# Patient Record
Sex: Female | Born: 1980 | Race: White | Hispanic: No | Marital: Married | Smoking: Never smoker
Health system: Southern US, Community
[De-identification: ages and names within clinical notes are randomized; demographics above are authoritative.]

## PROBLEM LIST (undated history)

## (undated) ENCOUNTER — Encounter

## (undated) ENCOUNTER — Encounter: Attending: Ambulatory Care | Primary: Ambulatory Care

## (undated) ENCOUNTER — Encounter: Attending: Internal Medicine | Primary: Internal Medicine

## (undated) ENCOUNTER — Encounter: Attending: Family Medicine | Primary: Family Medicine

## (undated) ENCOUNTER — Ambulatory Visit
Payer: PRIVATE HEALTH INSURANCE | Attending: Student in an Organized Health Care Education/Training Program | Primary: Student in an Organized Health Care Education/Training Program

## (undated) ENCOUNTER — Ambulatory Visit

## (undated) ENCOUNTER — Encounter
Attending: Student in an Organized Health Care Education/Training Program | Primary: Student in an Organized Health Care Education/Training Program

## (undated) ENCOUNTER — Encounter: Attending: Family | Primary: Family

## (undated) ENCOUNTER — Encounter: Attending: "Endocrinology | Primary: "Endocrinology

## (undated) ENCOUNTER — Telehealth: Payer: PRIVATE HEALTH INSURANCE

## (undated) ENCOUNTER — Telehealth: Attending: Surgery | Primary: Surgery

## (undated) ENCOUNTER — Ambulatory Visit: Payer: PRIVATE HEALTH INSURANCE

## (undated) ENCOUNTER — Telehealth: Attending: Internal Medicine | Primary: Internal Medicine

## (undated) ENCOUNTER — Encounter: Payer: PRIVATE HEALTH INSURANCE | Attending: Mental Health | Primary: Mental Health

## (undated) ENCOUNTER — Telehealth

## (undated) ENCOUNTER — Ambulatory Visit: Payer: Medicare (Managed Care) | Attending: Mental Health | Primary: Mental Health

## (undated) ENCOUNTER — Encounter: Attending: Infectious Disease | Primary: Infectious Disease

## (undated) ENCOUNTER — Ambulatory Visit: Payer: MEDICARE

## (undated) ENCOUNTER — Ambulatory Visit: Attending: Family | Primary: Family

## (undated) ENCOUNTER — Telehealth: Attending: Ambulatory Care | Primary: Ambulatory Care

## (undated) ENCOUNTER — Ambulatory Visit: Payer: PRIVATE HEALTH INSURANCE | Attending: Internal Medicine | Primary: Internal Medicine

## (undated) ENCOUNTER — Ambulatory Visit: Payer: PRIVATE HEALTH INSURANCE | Attending: Surgery | Primary: Surgery

## (undated) ENCOUNTER — Telehealth: Attending: Student Health | Primary: Student Health

## (undated) ENCOUNTER — Encounter: Attending: Mental Health | Primary: Mental Health

## (undated) ENCOUNTER — Telehealth: Attending: "Endocrinology | Primary: "Endocrinology

## (undated) ENCOUNTER — Encounter: Payer: Medicare (Managed Care) | Attending: Mental Health | Primary: Mental Health

## (undated) ENCOUNTER — Ambulatory Visit: Payer: PRIVATE HEALTH INSURANCE | Attending: Family Medicine | Primary: Family Medicine

## (undated) ENCOUNTER — Ambulatory Visit: Payer: PRIVATE HEALTH INSURANCE | Attending: Mental Health | Primary: Mental Health

## (undated) ENCOUNTER — Telehealth: Attending: Family | Primary: Family

## (undated) ENCOUNTER — Telehealth
Attending: Student in an Organized Health Care Education/Training Program | Primary: Student in an Organized Health Care Education/Training Program

## (undated) ENCOUNTER — Encounter: Payer: MEDICAID | Attending: Mental Health | Primary: Mental Health

## (undated) ENCOUNTER — Ambulatory Visit
Payer: Medicare (Managed Care) | Attending: Student in an Organized Health Care Education/Training Program | Primary: Student in an Organized Health Care Education/Training Program

## (undated) ENCOUNTER — Ambulatory Visit: Payer: PRIVATE HEALTH INSURANCE | Attending: "Endocrinology | Primary: "Endocrinology

## (undated) ENCOUNTER — Ambulatory Visit: Payer: MEDICARE | Attending: Mental Health | Primary: Mental Health

## (undated) ENCOUNTER — Ambulatory Visit
Payer: PRIVATE HEALTH INSURANCE | Attending: Rehabilitative and Restorative Service Providers" | Primary: Rehabilitative and Restorative Service Providers"

## (undated) ENCOUNTER — Telehealth: Attending: Family Medicine | Primary: Family Medicine

## (undated) ENCOUNTER — Ambulatory Visit: Attending: Mental Health | Primary: Mental Health

## (undated) ENCOUNTER — Ambulatory Visit: Payer: PRIVATE HEALTH INSURANCE | Attending: Family | Primary: Family

## (undated) DIAGNOSIS — M329 Systemic lupus erythematosus, unspecified: Secondary | ICD-10-CM

## (undated) DIAGNOSIS — H838X3 Other specified diseases of inner ear, bilateral: Secondary | ICD-10-CM

## (undated) DIAGNOSIS — G43909 Migraine, unspecified, not intractable, without status migrainosus: Secondary | ICD-10-CM

## (undated) DIAGNOSIS — E079 Disorder of thyroid, unspecified: Secondary | ICD-10-CM

## (undated) HISTORY — PX: THYROIDECTOMY: SHX17

## (undated) HISTORY — PX: ABDOMINAL HYSTERECTOMY: SHX81

## (undated) HISTORY — PX: FOOT SURGERY: SHX648

---
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
## 2004-08-20 ENCOUNTER — Emergency Department: Payer: Self-pay | Admitting: Emergency Medicine

## 2004-08-20 ENCOUNTER — Other Ambulatory Visit: Payer: Self-pay

## 2005-06-05 ENCOUNTER — Ambulatory Visit: Payer: Self-pay | Admitting: Family Medicine

## 2005-06-05 IMAGING — CR DG ABDOMEN 1V
1 series · 1 of 1 positions shown · non-contrast
Comparison: none

REASON FOR EXAM: LEFT upper pain
COMMENTS:

[view not recorded]
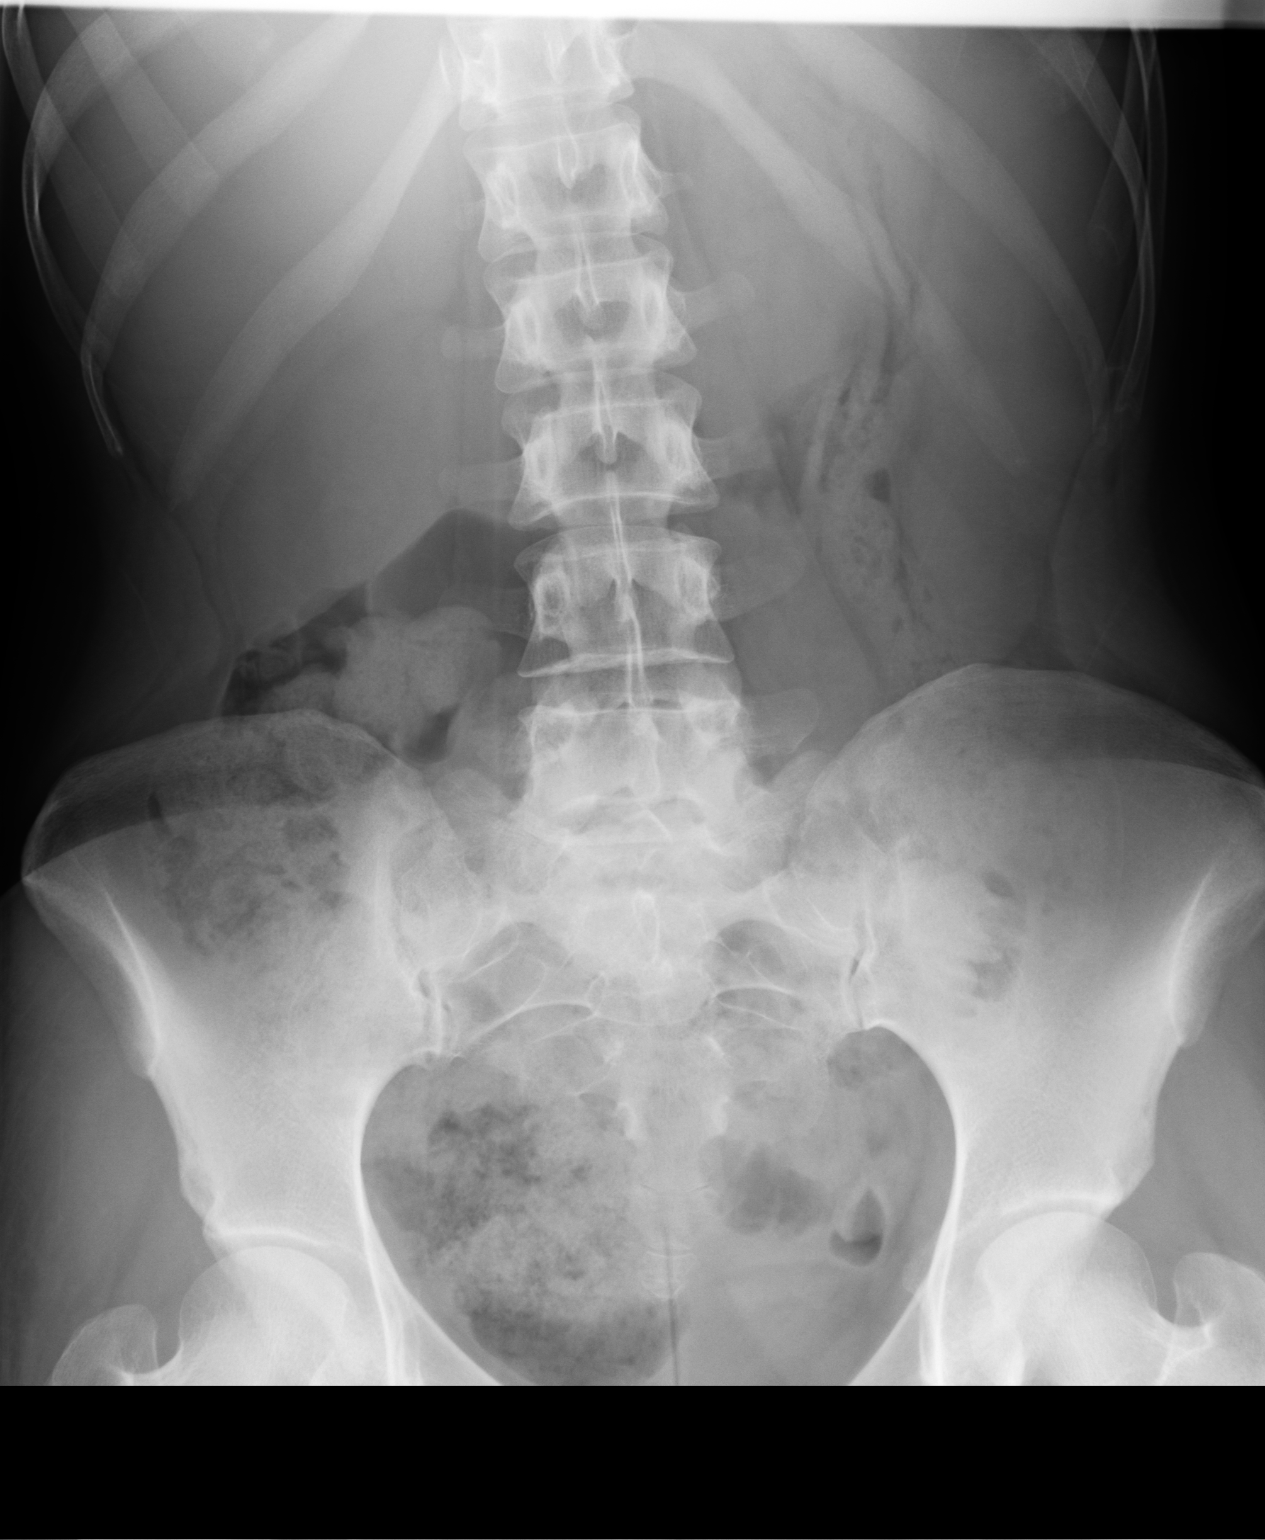

[1 of 1 positions shown; findings below may reference images not displayed]

PROCEDURE:     DXR - DXR KIDNEY URETER BLADDER  - [DATE]  [DATE]

RESULT:     A single AP view reveals a nonspecific gas pattern.  No abnormal
distention of large or small bowel is identified.

There is the suggestion that there is a prominent spleen and this should be
correlated clinically.  No abnormal calcifications are noted.
IMPRESSION: Nonspecific gas pattern.

Suggestion of mild splenomegaly which should be correlated clinically.

## 2005-06-09 ENCOUNTER — Ambulatory Visit: Payer: Self-pay | Admitting: Family Medicine

## 2005-06-28 ENCOUNTER — Encounter: Admission: RE | Admit: 2005-06-28 | Discharge: 2005-06-28 | Payer: Self-pay | Admitting: Rheumatology

## 2005-08-25 ENCOUNTER — Ambulatory Visit: Payer: Self-pay | Admitting: Family Medicine

## 2005-10-07 ENCOUNTER — Ambulatory Visit: Payer: Self-pay | Admitting: Family Medicine

## 2005-11-30 ENCOUNTER — Ambulatory Visit: Payer: Self-pay | Admitting: Gastroenterology

## 2005-12-29 ENCOUNTER — Ambulatory Visit: Payer: Self-pay | Admitting: Internal Medicine

## 2006-01-07 ENCOUNTER — Ambulatory Visit (HOSPITAL_COMMUNITY): Admission: RE | Admit: 2006-01-07 | Discharge: 2006-01-07 | Payer: Self-pay | Admitting: Obstetrics and Gynecology

## 2006-03-23 ENCOUNTER — Ambulatory Visit: Payer: Self-pay | Admitting: Internal Medicine

## 2006-04-16 ENCOUNTER — Other Ambulatory Visit: Admission: RE | Admit: 2006-04-16 | Discharge: 2006-04-16 | Payer: Self-pay | Admitting: Obstetrics and Gynecology

## 2006-04-22 ENCOUNTER — Ambulatory Visit: Payer: Self-pay | Admitting: Internal Medicine

## 2006-04-27 ENCOUNTER — Encounter: Admission: RE | Admit: 2006-04-27 | Discharge: 2006-04-27 | Payer: Self-pay | Admitting: Obstetrics and Gynecology

## 2007-02-04 ENCOUNTER — Ambulatory Visit: Payer: Self-pay | Admitting: Family Medicine

## 2007-02-07 ENCOUNTER — Encounter: Admission: RE | Admit: 2007-02-07 | Discharge: 2007-02-07 | Payer: Self-pay | Admitting: Endocrinology

## 2007-03-03 ENCOUNTER — Ambulatory Visit: Payer: Self-pay | Admitting: Family Medicine

## 2007-04-19 ENCOUNTER — Other Ambulatory Visit: Admission: RE | Admit: 2007-04-19 | Discharge: 2007-04-19 | Payer: Self-pay | Admitting: Obstetrics and Gynecology

## 2007-09-24 ENCOUNTER — Inpatient Hospital Stay (HOSPITAL_COMMUNITY): Admission: AD | Admit: 2007-09-24 | Discharge: 2007-09-24 | Payer: Self-pay | Admitting: Obstetrics and Gynecology

## 2007-09-28 ENCOUNTER — Inpatient Hospital Stay (HOSPITAL_COMMUNITY): Admission: AD | Admit: 2007-09-28 | Discharge: 2007-09-28 | Payer: Self-pay | Admitting: Obstetrics and Gynecology

## 2007-11-06 ENCOUNTER — Inpatient Hospital Stay (HOSPITAL_COMMUNITY): Admission: AD | Admit: 2007-11-06 | Discharge: 2007-11-07 | Payer: Self-pay | Admitting: Obstetrics and Gynecology

## 2007-11-06 ENCOUNTER — Encounter (INDEPENDENT_AMBULATORY_CARE_PROVIDER_SITE_OTHER): Payer: Self-pay | Admitting: Obstetrics and Gynecology

## 2007-12-30 ENCOUNTER — Encounter: Admission: RE | Admit: 2007-12-30 | Discharge: 2007-12-30 | Payer: Self-pay | Admitting: Endocrinology

## 2008-01-02 ENCOUNTER — Encounter: Payer: Self-pay | Admitting: Maternal & Fetal Medicine

## 2008-01-10 ENCOUNTER — Encounter (INDEPENDENT_AMBULATORY_CARE_PROVIDER_SITE_OTHER): Payer: Self-pay | Admitting: Diagnostic Radiology

## 2008-01-10 ENCOUNTER — Encounter: Admission: RE | Admit: 2008-01-10 | Discharge: 2008-01-10 | Payer: Self-pay | Admitting: Endocrinology

## 2008-01-10 ENCOUNTER — Other Ambulatory Visit: Admission: RE | Admit: 2008-01-10 | Discharge: 2008-01-10 | Payer: Self-pay | Admitting: Diagnostic Radiology

## 2008-01-26 ENCOUNTER — Encounter: Payer: Self-pay | Admitting: Maternal & Fetal Medicine

## 2008-03-12 ENCOUNTER — Ambulatory Visit (HOSPITAL_COMMUNITY): Admission: RE | Admit: 2008-03-12 | Discharge: 2008-03-13 | Payer: Self-pay | Admitting: General Surgery

## 2008-03-12 ENCOUNTER — Encounter (HOSPITAL_BASED_OUTPATIENT_CLINIC_OR_DEPARTMENT_OTHER): Payer: Self-pay | Admitting: General Surgery

## 2008-09-27 ENCOUNTER — Ambulatory Visit: Payer: Self-pay

## 2009-07-05 ENCOUNTER — Emergency Department: Payer: Self-pay | Admitting: Emergency Medicine

## 2009-07-12 ENCOUNTER — Emergency Department: Payer: Self-pay | Admitting: Emergency Medicine

## 2009-12-09 ENCOUNTER — Observation Stay: Payer: Self-pay

## 2009-12-10 ENCOUNTER — Inpatient Hospital Stay: Payer: Self-pay | Admitting: Unknown Physician Specialty

## 2010-07-13 ENCOUNTER — Encounter: Payer: Self-pay | Admitting: Endocrinology

## 2010-09-02 ENCOUNTER — Ambulatory Visit: Payer: Self-pay | Admitting: Family Medicine

## 2010-11-04 NOTE — Op Note (Signed)
[name], [name]                ACCOUNT NO.:  [id number]   MEDICAL RECORD NO.:  [id number]          PATIENT TYPE:  OIB   LOCATION:  5123                         FACILITY:  [facility]   PHYSICIAN:  Leonie Man, M.D.   DATE OF BIRTH:  13-Feb-1981   DATE OF PROCEDURE:  03/12/2008  DATE OF DISCHARGE:                               OPERATIVE REPORT   PREOPERATIVE DIAGNOSIS:  Multinodular goiter with compression.   POSTOPERATIVE DIAGNOSIS:  Multinodular goiter with compression.   PROCEDURE:  Total thyroidectomy.   SURGEON:  Leonie Man, MD   ASSISTANT:  Amber L. Freida Busman, MD   ANESTHESIA:  General.   SURGICAL FINDINGS:  Multinodular goiter.   ESTIMATED BLOOD LOSS:  Minimal.   COMPLICATIONS:  None apparent.   The patient returned to the PACU in excellent condition.   INDICATIONS:  Ms. Katie Hendricks is a 30 year old female with increasing  pressure symptoms within her neck. These symptoms are  primarily on the  right side, causing difficulty swallowing and increasing hoarseness.  The patient has been on thyroid suppression, at a 137 mcg of Synthroid  daily with continued growth of her thyroid gland.  She patient comes to  the operating room now for total thyroidectomy after the risks and  potential benefits of surgery have been fully discussed with her.  All  questions answered, and consent obtained.  The patient is aware of the  additional risks and possibility of postoperative hypoparathyroidism  and/or hoarsenessand stridor due to recurrent laryngeal nerve injury.  She understands and gives her consent.   PROCEDURE:  The patient was positioned supinely.  The head and neck  hyperextended with a roll placed between the shoulders.  The neck was  prepped and draped to be included in the sterile operative field.  Identification of the patient as Katie Hendricks, and the procedure to be  performed as total thyroidectomy was carried out.  All presurgical  precautions were observed to  the satisfaction of the surgical team.   A transverse incision was made in the central portion of the neck,  deepened through skin and subcutaneous tissues and across the platysma  muscle.  Superior myocutaneous flap was raised up to the thyroid  cartilage and inferior myocutaneous flap was raised down toward the  sternal notch.  The midline strap muscles were opened in the direction  of their fibers and dissection, and the strap muscles were retracted  laterally over to the right side of the neck, in which was a larger  portion of the thyroid gland.  The gland is mobilized from out of the  sulcus of the neck and dissection carried up toward the superior pole,  where the superior pole vessels were identified and suture ligated with  2-0 silk sutures and with medium clips.  The superior pole was released.  Superior parathyroid was identified and spared.  The thyroid was  serially dissected free from the surrounding soft tissue being careful  not to encounter the recurrent laryngeal nerve.  At the inferior portion  of the thyroid, the inferior thyroid vessels were treated with clips.  The thyroid  was then dissected free from off the trachea and carried  across the midline taking the pyramidal lobe in the course of the  dissection.   The right side of the neck was then packed and attention turned to the  left side of the neck where similarly, the strap muscles were dissected  free from off the capsule of the thyroid, carrying the dissection to the  left lateral side, and mobilizing the thyroid out of the sulcus of the  neck.  The dissection was carried superiorly with isolation of the  superior thyroid vessels, and these were doubly ligated and clipped.  Both the superior and inferior parathyroids were identified.  These were  protected throughout the course of dissection, the recurrent laryngeal  nerve was also detected and protected throughout the course of the  dissection.  Dissection  was carried down along the thyroid dissecting it  from off the left trachea and carrying the dissection across the  midline.  The thyroid ima vessels were transected with the harmonic  scalpel.  The thyroid was released and forwarded for pathologic  evaluation.  All the areas of the dissection within the neck were then  thoroughly irrigated.  Additional bleeding points were treated with  electrocautery.  Sponge, instrument, and sharp counts were verified.  I  placed Surgicel pads over all the areas of dissection, and the midline  was then closed with interrupted 3-0 Vicryl sutures.  The areas out of  the flaps were also checked for hemostasis.  There were no additional  bleeding points noted, and the platysma muscle was then closed with  interrupted 3-0 Vicryl sutures.  The skin was closed with running 5-0  Monocryl suture and reinforced with Steri-Strips.  A sterile dressing  was applied, and the anesthetic reversed, and the patient removed from  the operating room to the recovery room in stable condition.  She  tolerated the procedure well.      Leonie Man, M.D.  Electronically Signed     PB/MEDQ  D:  03/12/2008  T:  03/13/2008  Job:  [id number]   cc:   Dorisann Frames, M.D.

## 2011-01-07 ENCOUNTER — Ambulatory Visit: Payer: Self-pay | Admitting: Ophthalmology

## 2011-01-11 ENCOUNTER — Ambulatory Visit: Payer: Self-pay

## 2011-03-17 LAB — URINALYSIS, ROUTINE W REFLEX MICROSCOPIC
Bilirubin Urine: NEGATIVE
Glucose, UA: NEGATIVE
Hgb urine dipstick: NEGATIVE
Protein, ur: NEGATIVE
Urobilinogen, UA: 0.2

## 2011-03-17 LAB — HCG, QUANTITATIVE, PREGNANCY: hCG, Beta Chain, Quant, S: 73961 — ABNORMAL HIGH

## 2011-03-17 LAB — CBC
HCT: 34 — ABNORMAL LOW
Hemoglobin: 11.7 — ABNORMAL LOW
MCHC: 34.3
MCV: 77.5 — ABNORMAL LOW
Platelets: 237
RDW: 14.4

## 2011-03-17 LAB — DIFFERENTIAL
Basophils Absolute: 0
Eosinophils Absolute: 0.1
Eosinophils Relative: 1
Lymphocytes Relative: 20
Monocytes Absolute: 0.5

## 2011-03-18 LAB — CBC
HCT: 27.5 — ABNORMAL LOW
HCT: 29.7 — ABNORMAL LOW
Hemoglobin: 9.4 — ABNORMAL LOW
MCHC: 34.2
MCHC: 34.4
MCV: 77.9 — ABNORMAL LOW
Platelets: 213
Platelets: 229
RBC: 3.82 — ABNORMAL LOW
RDW: 16.1 — ABNORMAL HIGH
WBC: 9.3

## 2011-03-23 LAB — PROTIME-INR: INR: 1.1

## 2011-03-23 LAB — COMPREHENSIVE METABOLIC PANEL
ALT: 15
AST: 21
Albumin: 4
Calcium: 8.9
GFR calc Af Amer: >60
Potassium: 3.8
Sodium: 138
Total Protein: 7.3

## 2011-03-23 LAB — CBC
MCHC: 32.5
RDW: 15.5

## 2011-03-23 LAB — DIFFERENTIAL
Eosinophils Absolute: 0.2
Eosinophils Relative: 4
Lymphs Abs: 1.5
Monocytes Absolute: 0.6
Monocytes Relative: 11

## 2011-04-06 ENCOUNTER — Ambulatory Visit: Payer: Self-pay | Admitting: Rheumatology

## 2011-07-16 ENCOUNTER — Encounter: Payer: Self-pay | Admitting: Maternal and Fetal Medicine

## 2011-08-17 ENCOUNTER — Encounter: Payer: Self-pay | Admitting: Obstetrics & Gynecology

## 2011-09-06 ENCOUNTER — Emergency Department: Payer: Self-pay | Admitting: Emergency Medicine

## 2011-09-06 LAB — COMPREHENSIVE METABOLIC PANEL
Alkaline Phosphatase: 60 U/L (ref 50–136)
Anion Gap: 12 (ref 7–16)
Calcium, Total: 8.2 mg/dL — ABNORMAL LOW (ref 8.5–10.1)
Co2: 24 mmol/L (ref 21–32)
Osmolality: 275 (ref 275–301)
Potassium: 3.4 mmol/L — ABNORMAL LOW (ref 3.5–5.1)
Sodium: 139 mmol/L (ref 136–145)

## 2011-09-06 LAB — URINALYSIS, COMPLETE
Ph: 6 (ref 4.5–8.0)
Protein: NEGATIVE
RBC,UR: 2 /HPF (ref 0–5)

## 2011-09-06 LAB — CBC
MCV: 73 fL — ABNORMAL LOW (ref 80–100)
Platelet: 229 10*3/uL (ref 150–440)
RBC: 4.54 10*6/uL (ref 3.80–5.20)
WBC: 7.7 10*3/uL (ref 3.6–11.0)

## 2011-09-16 ENCOUNTER — Ambulatory Visit: Payer: Self-pay | Admitting: Obstetrics and Gynecology

## 2011-09-16 LAB — HEMOGLOBIN: HGB: 9.3 g/dL — ABNORMAL LOW (ref 12.0–16.0)

## 2011-09-17 ENCOUNTER — Ambulatory Visit: Payer: Self-pay | Admitting: Obstetrics and Gynecology

## 2011-09-21 LAB — PATHOLOGY REPORT

## 2012-03-21 ENCOUNTER — Ambulatory Visit: Payer: Self-pay | Admitting: General Practice

## 2012-03-25 ENCOUNTER — Emergency Department: Payer: Self-pay | Admitting: Emergency Medicine

## 2012-03-25 LAB — URINALYSIS, COMPLETE
Ketone: NEGATIVE
Nitrite: NEGATIVE
Ph: 8 (ref 4.5–8.0)
Protein: NEGATIVE
RBC,UR: <1 /HPF (ref 0–5)
Squamous Epithelial: 1

## 2012-03-25 LAB — CBC
HCT: 33.4 % — ABNORMAL LOW (ref 35.0–47.0)
HGB: 10.9 g/dL — ABNORMAL LOW (ref 12.0–16.0)
MCH: 23.9 pg — ABNORMAL LOW (ref 26.0–34.0)
MCV: 74 fL — ABNORMAL LOW (ref 80–100)
RBC: 4.54 10*6/uL (ref 3.80–5.20)

## 2012-03-25 LAB — CK TOTAL AND CKMB (NOT AT ARMC): CK, Total: 27 U/L (ref 21–215)

## 2012-03-25 LAB — COMPREHENSIVE METABOLIC PANEL
Calcium, Total: 8.4 mg/dL — ABNORMAL LOW (ref 8.5–10.1)
Chloride: 107 mmol/L (ref 98–107)
Osmolality: 280 (ref 275–301)
Potassium: 3.8 mmol/L (ref 3.5–5.1)
SGOT(AST): 21 U/L (ref 15–37)
Total Protein: 7.5 g/dL (ref 6.4–8.2)

## 2012-03-25 LAB — TROPONIN I: Troponin-I: <0.02 ng/mL

## 2012-04-01 ENCOUNTER — Emergency Department: Payer: Self-pay | Admitting: Emergency Medicine

## 2012-04-01 LAB — COMPREHENSIVE METABOLIC PANEL
Albumin: 4.1 g/dL (ref 3.4–5.0)
Alkaline Phosphatase: 91 U/L (ref 50–136)
Anion Gap: 8 (ref 7–16)
Calcium, Total: 8.4 mg/dL — ABNORMAL LOW (ref 8.5–10.1)
Co2: 27 mmol/L (ref 21–32)
Creatinine: 0.74 mg/dL (ref 0.60–1.30)
EGFR (African American): >60
EGFR (Non-African Amer.): >60
Glucose: 117 mg/dL — ABNORMAL HIGH (ref 65–99)
SGPT (ALT): 19 U/L (ref 12–78)
Sodium: 138 mmol/L (ref 136–145)

## 2012-04-01 LAB — CBC
MCH: 23.9 pg — ABNORMAL LOW (ref 26.0–34.0)
MCHC: 32.3 g/dL (ref 32.0–36.0)
Platelet: 304 10*3/uL (ref 150–440)
RDW: 15.6 % — ABNORMAL HIGH (ref 11.5–14.5)

## 2012-04-01 LAB — CK TOTAL AND CKMB (NOT AT ARMC): CK-MB: <0.5 ng/mL — ABNORMAL LOW (ref 0.5–3.6)

## 2012-04-01 LAB — TROPONIN I: Troponin-I: <0.02 ng/mL

## 2012-04-08 ENCOUNTER — Emergency Department: Payer: Self-pay | Admitting: Emergency Medicine

## 2012-04-08 ENCOUNTER — Ambulatory Visit: Payer: Self-pay | Admitting: General Practice

## 2012-04-08 LAB — CBC WITH DIFFERENTIAL/PLATELET
Lymphocyte %: 16 %
MCHC: 32.5 g/dL (ref 32.0–36.0)
Monocyte %: 13 %
Neutrophil %: 69 %
Platelet: 260 10*3/uL (ref 150–440)
RDW: 15.9 % — ABNORMAL HIGH (ref 11.5–14.5)
WBC: 10.1 10*3/uL (ref 3.6–11.0)

## 2012-04-08 LAB — COMPREHENSIVE METABOLIC PANEL
Albumin: 3.8 g/dL (ref 3.4–5.0)
Anion Gap: 10 (ref 7–16)
Calcium, Total: 8.2 mg/dL — ABNORMAL LOW (ref 8.5–10.1)
Co2: 27 mmol/L (ref 21–32)
Creatinine: 0.66 mg/dL (ref 0.60–1.30)
EGFR (African American): >60
EGFR (Non-African Amer.): >60
Glucose: 103 mg/dL — ABNORMAL HIGH (ref 65–99)
Potassium: 3.6 mmol/L (ref 3.5–5.1)
SGOT(AST): 12 U/L — ABNORMAL LOW (ref 15–37)
Sodium: 139 mmol/L (ref 136–145)

## 2012-04-08 LAB — CK: CK, Total: 13 U/L — ABNORMAL LOW (ref 21–215)

## 2012-04-08 LAB — TROPONIN I: Troponin-I: <0.02 ng/mL

## 2012-04-27 ENCOUNTER — Ambulatory Visit: Payer: Self-pay | Admitting: Gastroenterology

## 2012-09-14 DIAGNOSIS — E039 Hypothyroidism, unspecified: Secondary | ICD-10-CM | POA: Insufficient documentation

## 2012-10-25 DIAGNOSIS — M329 Systemic lupus erythematosus, unspecified: Secondary | ICD-10-CM | POA: Insufficient documentation

## 2012-11-29 DIAGNOSIS — J4520 Mild intermittent asthma, uncomplicated: Secondary | ICD-10-CM | POA: Insufficient documentation

## 2013-05-16 ENCOUNTER — Ambulatory Visit: Payer: Self-pay | Admitting: General Practice

## 2014-10-14 NOTE — Op Note (Signed)
PATIENT NAME:  Katie Hendricks, [name] MR#:  [id number] DATE OF BIRTH:  November 22, 1980  DATE OF PROCEDURE:  09/17/2011  PREOPERATIVE DIAGNOSIS: Incomplete abortion.   POSTOPERATIVE DIAGNOSIS: Incomplete abortion.   PROCEDURE PERFORMED: Suction dilatation and curettage as well as diagnostic laparoscopy.   PRIMARY SURGEON: Florina Oundreas M. Bonney AidStaebler, M.D.   ASSISTANT: Thomasene MohairStephen Jackson, M.D.   ANESTHESIA:  General.   ESTIMATED BLOOD LOSS: 200 mL. OPERATIVE FLUIDS: 1,200 mL of crystalloid.  URINE OUTPUT: 500 mL.  COMPLICATIONS: Extremely retroverted uterus requiring laparoscopy for completion of dilatation and curettage procedure.   FINDINGS: Bimanual exam at the beginning of the case revealed an extremely retroverted, but mobile uterus that had been noted in the office. The uterus sounded to 8 cm. Sharp dilatation and curettage returned a minimal amount of tissue consistent with retained POC. Following the sharp curettage, the patient continued to have moderate bleeding from the cervical os. The decision was made to proceed with suction dilatation and curettage. However, given the retroverted nature of the patient's uterus, it was deemed safer to proceed with diagnostic laparoscopy to observe the uterus during this portion of the procedure.   SPECIMENS REMOVED: Endometrial curettings/products of conception.   POSTOPERATIVE CONDITION: Stable.   PROCEDURE IN DETAIL: Risks, benefits, and alternatives of the procedure were discussed with the patient prior to proceeding to the Operating Room. The patient was placed under general anesthesia with a LMA airway. She was positioned in the dorsal lithotomy position using candy- cane stirrups, prepped and draped in the usual sterile fashion. Prior to proceeding with the procedure, time-out was performed. The patient's bladder was straight catheterized for approximately 200 mL of clear urine. Sterile speculum was placed. The cervix was visualized. The anterior lip grasped with  a single-tooth tenaculum. Applying traction to the tenaculum the uterus was attempted to be straightened out. However, sounding the uterus to 8 cm, it was still noted to be fairly retroverted. The decision was made to start with sharp curettage rather than a suction curette given this retroverted uterus. Sharp curettage yielded minimal to moderate amount of tissue which was consistent in appearance with retained products of conception. Following this sharp curettage, there was still noted to be some bogginess in the posterior wall of the uterus and it was decided rather than proceed with blind suction curettage at this point, it would be safer to proceed with a diagnostic laparoscopy to observe the uterus during the suction curettage portion of the case. The patient was taken out of the dorsal lithotomy position. Her LMA airway was replaced with an endotracheal tube. She was reprepped and redraped in the dorsal lithotomy position using Allen stirrups. A stab incision was made at the base of the umbilicus and a 5 mm Visiport was placed. After placement of the 5 mm Visiport, pneumoperitoneum was established and a second 5 mm assistant port was placed left lateral. At this point, the uterus was visualized and noted to be fairly retroverted. The patient was placed in Trendelenburg. Using the assistant port the uterus was held up to help straighten out the plane. The remainder of the case was then undertaken. A sterile speculum was replaced. The single-tooth tenaculum was placed on the anterior lip of the cervix and 2 to 3 passes with the suction curette were made noting minimal amount of further tissue. Following the suction curette, the cervix was noted to be hemostatic with no further bleeding. At this point, attention was turned to the abdomen. The pneumoperitoneum was evacuated and the ports were  removed. Both 5 mm port sites were then closed with Dermabond. Each port site was injected with approximately 5 mL of 1%  Sensorcaine. The patient tolerated the procedure well. Sponge, needle, and instrument counts were correct x2 and she was taken to the recovery room in stable condition.    ____________________________ Florina Ou. Bonney Aid, MD ams:ap D: 09/17/2011 16:05:59 ET T: 09/17/2011 16:22:45 ET JOB#: [id number]  cc: Florina Ou. Bonney Aid, MD, <Dictator>  Lorrene Reid MD ELECTRONICALLY SIGNED 09/22/2011 22:05

## 2014-10-14 NOTE — Consult Note (Signed)
Referral Information:   Reason for Referral Return consultation in setting of RA/SLE, surgical hypothyroidism and second trimester loss    Referring Physician [name]    Prenatal Hx 34 year-old G3 P1011 presents for followup consultation to review requested medical records.  She was seen by Dr. [name] for [facility]conceptual [facility] 07/16/11.  She also was [facility] on 07/16/09, 09/24/09, and 11/12/09 during her prior pregnancy.  She reports that she is pregnant with an LMP of 07/06/11, now at 6 0/7 weeks. She has not yet had an ultrasound and [facility].  She denies vaginal bleeding or abdominal/pelvic pain.  See dictated consultations from 07/16/11, 07/16/09, 09/24/09, and 11/12/09 for her prior history.  She discontinued her Plaquenil therapy two weeks ago when she found out she was pregnant. She is currently taking Prednisone 10 mg daily and her endocrinologist recently adjusted her thyroid supplementation. She denies any asthma symptoms.  She is taking a prenatal vitamin, folic acid and a daily baby aspirin (confirmed with her 81 mg).  She is being followed by Dr. 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  Overall, Katie Hendricks states she feels good. Just some minor joint aching.  She has had no worsening of her symptoms since stopping her plaquenil.   Home Medications: Medication Instructions Status  folic acid 0.4 mg oral tablet 1  orally once a day  Active  Caltrate 600 with D 1   once a day  Active  multivitamin, prenatal 1   once a day  Active  albuterol    x 1 days PRN   Active  Advair Diskus    2puffs twice a day Active  aspirin 81 mg oral tablet 1 tab(s) orally once a day Active  Armour Thyroid 135 milligram(s) orally 3 times a week, 180mg  4 times a week Active  predniSONE 10 milligram(s) orally once a day Active  potassium chloride 8 mEq oral  tablet, extended release 1 tab(s) orally once a day Active   Allergies:   Lodine: Swelling, Resp. Distress  Methotrexate: Alt Ment Status, Other  Enbrel: Other  allergic to [place]..."gives me Lupus like reaction, mainly rash on my face": Other  Vital Signs/Notes:  Nursing Vital Signs: **Vital Signs.:   25-Feb-13 08:57   Systolic BP Systolic BP 115   Diastolic BP (mmHg) Diastolic BP (mmHg) 59   Review Of Systems:   Subjective No complaints. No vaginal bleeding. No abdominal/pelvic pain.    Fever/Chills No    Cough No    Nausea/Vomiting No    SOB/DOE No    Chest Pain No    Tolerating Diet Yes    Medications/Allergies Reviewed Medications/Allergies reviewed  Also taking a daily baby aspirin 81mg      Additional Lab/Radiology Notes Echo, [facility], 08/22/04: Borderline mitral valve prolapse with trival MR. Normal LV EF  Labs, [facility], 02/12/05: Positive anti-microsomal throid antibodies  Labs, [facility], 07/16/09: -Factor V Leiden negative, Prothrombin gene mutation negative, Prot S 81%, Prot C 96%, ATIII 107%, Anti-cardiolipin Ab negative, Anti-beta 2 glycoprotein Ab negative, Lupus anticoagulant negative, Anti Ro-La negative, Anti-ds DNA 10 (negative)  Labs, [facility] 06/23/11: -Lupus anticoagulant negative, anti-cardiolipin Ab negative, anti-phosphatidylserine negative, Anti Ro-La negative, uric acid 3.3, ANA positive 1:640, RF 56 (elevated), CRP 1.65 (elevated), Plt count 284   Impression/Recommendations:   Impression 34 year-old G3 P1011 here to  review labs that were recently forwarded from her Rheumatologist.  She is also newly pregnant.  She has a history of 19-week loss with presentation consistent with placental abruption followed by a full-term elective c-section at 37 weeks after mature FLM.  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    Recommendations 1.  Rheumatologic disorder (RHUPUS, overlap of RA and SLE symptoms), now early pregnancy.  We discussed the natural course of rheumatologic disorders in pregnancy, especially those that enter pregnancy in relative good control.  There is 1/3 chance of improved symptoms, 1/3 chance of stable symptoms and 1/3 chance of symptom exacerbation. She is currently well-controlled on low-dose prednisone and has discontinued Plaquenil.  I reassured her that Plaquenil is safe in pregnancy and that it is much better to have symptoms controlled than any potential risk of the medication.  First trimester prednisone exposure does increase the risk for cleft lip/palate but again her dose is low and symptom control is important.  Plaquenil and or higher dose steroids can be added in the setting of disease exacerbation.  SLE increases the risk for poor fetal growth, fetal death, placental abruption, maternal kidney disease and preeclampsia.  Recommend obtaining baseline LFTs, uric aicd and 24 hour urine protein/creatiine levels.  Baby aspirin, which she is on, though may slightly increase the risk for first trimester loss, may potentially decrease the risk for preelampsia and poor fetal growth. I recommend to continue the daily baby ASA.   She does not have evidence of anti-phospholipid antibodies or Ro/La antibodies.  2. History of placenta abruption.  [name]'s first pregnancy was complicated by first trimester bleeding and eventual placental abruption at 19 weeks with fetal loss. Her thrombophilia panel is negative. She denies history of hypertension, drug use or trauma in that pregnancy.  She is currently on a baby aspirin.  Recommend regular assessment of maternal blood pressure and fetal testing as outlined below.  Women with history of abruption are at increased risk of placental abruption in subsequent  pregnancies.  ....continued below....     Comments ....continued from above....  3. History of Hashimotos thyroiditis, now with surgically absent thryoid.  Adryana is followed by an endocrinologist who knows she is pregnant and has recently made adjustements in her thryoid medication. Follow fetal growth and she is planning on having regular thyroid level assessment throughout the pregnancy.    4. History of borderline mitral valve prolapse on echo in 2006. No symptoms.  Repeat echo to ensure no progression.  Summary: -Obtain baseline uric acid, LFTs, platelet count and 24 hour urine studies as she is at risk for preeclampsia -Offer aneuploidy screening and cystic fibrosis carrier screening -Detailed ultrasound at 18 weeks (first trimester prednisone exposure, history of abruption) -Assessment of fetal growth montly starting at 26-28 weeks -Twice weekly fetal testing starting at 32 weeks -Delivery at 39 weeks. Document fetal lung maturity if elect for earlier delivery -Follow throid hormone levels at least every trimester -Continue prenatal vitamin, folic acid, baby asprin and prednisone. Plaquenil is safe in pregnancy and can be added if needed. -Repeat maternal echo as has history of borderline MVP     Total Time Spent with Patient 30 minutes    >50% of visit spent in couseling/coordination of care yes    Office Use Only 99214  Office Visit Level 4 ( ) EST  detailed office/outpt   Coding Description: OTHER: Pregnant with SLE and rheumatoid arthritis, maternal thyroid disease, history of placental abruption.  Electronic Signatures: Katie Hendricks, [place] (MD)  (Signed [phone] 10:53)  Authored: Referral, Home Medications, Allergies, Vital Signs/Notes, Exam, Lab/Radiology Notes, Impression, Other Comments, Billing, Coding Description   Last Updated: 25-Feb-13 10:53 by Katie Hendricks, [place] (MD)

## 2014-10-14 NOTE — Consult Note (Signed)
Referral Information:   Reason for Referral Prepregnancy Consult 1) Systemic Lupus erythematosus 2) Second trimester pregnancy loss secondary to abruption 3) Surgical hypothyroidism -- with history of Hashimoto's Thyroiditis 4) Previous cesarean delivery    Referring Physician Dr. Luella Cook    Past Obstetrical Hx 28-Oct-2007 -- Fetal death at 70 04-13-[redacted] weeks gestation secondary to placenta abruption. 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   Negative thrombophilia work up.   Normal uterine cavity  10/27/09 - 37 week elective cesarean delivery after amniocentesis; 7# 14oz female. Pregnancy complicated by subchorionic hemorrhage   Home Medications:  folic acid 0.4 mg oral tablet: 1  orally once a day , Active  Caltrate 600 with D: 1   once a day , Active  multivitamin, prenatal: 1   once a day , Active  albuterol:    x 1 days PRN  , Active  Advair Diskus:    2puffs twice a day, Active  Armour Thyroid: 135 milligram(s) orally 5 times a week,  2 times a week, Active  hydrocodone: 1-2 tab(s) orally once a day, As Needed- for Pain , Active  predniSONE: 7.5 milligram(s) orally once a day, Active  Plaquenil Sulfate 200 mg oral tablet: 1 tab(s) orally once a day, Active  aspirin 81 mg oral tablet: 1 tab(s) orally once a day, Active  Allergies:   Lodine: Swelling, Resp. Distress  Methotrexate: Alt Ment Status, Other  Enbrel: Other  allergic to [place]..."gives me Lupus like reaction, mainly rash on my face": Other  Vital Signs/Notes:  Nursing Vital Signs: **Vital Signs.:   24-Jan-13 13:06   Vital Signs Type Routine   Temperature Temperature (F) 98.4   Celsius 36.8   Temperature Source oral   Pulse Pulse 83   Pulse source per Dinamap   Respirations Respirations 14   Systolic BP Systolic BP 101   Diastolic BP (mmHg) Diastolic BP (mmHg) 58   Mean BP 72   BP Source Dinamap    Perinatal Consult:   Past Medical History cont'd Patient is noted to have had Hashimoto's thyroiditis. Ultrasound of thyroid was supicious, so she underwent thyroidectomy.  Patient was carrying the diagnosis of Rheumatoid Arthritis. In 27-Oct-2009, her rheumatologist changed diagnosis to SLE. For this, she is now using Predinsone and Plaquenil. The patient reports that her lupus mostly manifests with arthralgias and myalgias. She states there is no pulmonary or renal involvement.   Impression/Recommendations:   Impression 1) Patient who desires future pregnancy 2) Systemic Lupus Erythematosus 3) G1 with second trimester fetal death secondary to abruption 4) Previous cesarean delivery    Recommendations 1) Patient to have return visit 2) With that visit, we will review what lab tests were done to change diagnosis from rheumatoid arthritis to systemic lupus erythematosus 3) With that visit, we will see if SSA, SSB, renal chemistries, liver functions and other lab tests were done to better characterize  the patient's lupus and what impact her specific situation could impact future pregnancy. The patient may need further testing if the review of her recent testing shows the work up to be incomplete. 4) Patient incouraged to get influenza vaccine 5) With next pregnancy, the patient may continue Prednisone and Plaquenil 6) With next pregnancy, assuming that the lupus is well controlled and no other complications arise, the patient need not have an elective delivery before 39 weeks. 7) With next pregnancy, patient should have  TFTs done at least trimesterly, and adjust her thyroid replacement accordingly.     Total Time Spent with Patient 30 minutes    >50% of visit spent in couseling/coordination of care yes    Office Use Only 99242  Level 2 (30min) NEW office consult exp prob focused   Coding Description: OTHER: Systemic Lupus Erythematosus.  Electronic Signatures: Marcelino ScotBrancazio, Devyne Hauger (MD)  (Signed  24-Jan-13 14:19)  Authored: Referral, Home Medications, Allergies, Vital Signs/Notes, Consult, Impression, Billing, Coding Description   Last Updated: 24-Jan-13 14:19 by Marcelino ScotBrancazio, Kerrigan Gombos (MD)

## 2014-12-30 DIAGNOSIS — I319 Disease of pericardium, unspecified: Secondary | ICD-10-CM | POA: Insufficient documentation

## 2015-01-16 DIAGNOSIS — D691 Qualitative platelet defects: Secondary | ICD-10-CM | POA: Insufficient documentation

## 2015-04-25 ENCOUNTER — Encounter: Payer: Self-pay | Admitting: Physician Assistant

## 2015-04-25 ENCOUNTER — Ambulatory Visit: Payer: Self-pay | Admitting: Physician Assistant

## 2015-04-25 VITALS — BP 120/70 | HR 110 | Temp 98.2°F

## 2015-04-25 DIAGNOSIS — J069 Acute upper respiratory infection, unspecified: Secondary | ICD-10-CM

## 2015-04-25 MED ORDER — AZITHROMYCIN 250 MG PO TABS: ORAL_TABLET | ORAL | Status: DC

## 2015-04-25 NOTE — Progress Notes (Signed)
S: C/o runny nose and congestion for 4 days, no fever, chills, cough is productive, having some cp/sob, denies v/d; mucus was green this am, cough is sporadic,   Using otc meds: zyrtec d  O: PE: vitals wnl except hr increased at 110, nad,  perrl eomi, normocephalic, tms dull, nasal mucosa red and swollen, throat injected, neck supple no lymph, lungs c t a, cv tachy with reg rhythm  neuro intact  A:  Acute uri   P: zpack,  drink fluids, continue regular meds , use otc meds of choice, return if not improving in 5 days, return earlier if worsening , stop zyrtec d due to increased hr, drink plenty of water, go to ER if HR over 120

## 2015-10-07 ENCOUNTER — Ambulatory Visit: Payer: Self-pay | Admitting: Physician Assistant

## 2015-10-07 ENCOUNTER — Encounter: Payer: Self-pay | Admitting: Physician Assistant

## 2015-10-07 VITALS — BP 110/75 | HR 98 | Temp 98.5°F

## 2015-10-07 DIAGNOSIS — J018 Other acute sinusitis: Secondary | ICD-10-CM

## 2015-10-07 MED ORDER — AMOXICILLIN 875 MG PO TABS: 875.0000 mg | ORAL_TABLET | Freq: Two times a day (BID) | ORAL | Status: DC

## 2015-10-07 NOTE — Progress Notes (Signed)
S: C/o runny nose and congestion for 3 days, no fever, chills, cp/sob, v/d; mucus is green and thick, cough is sporadic, c/o of facial and dental pain.   Using otc meds:   O: PE: vitals wnl, nad, perrl eomi, normocephalic, tms dull, nasal mucosa red and swollen, throat injected, neck supple no lymph, lungs c t a, cv rrr, neuro intact  A:  Acute sinusitis   P: amoxil 875mg  bid x 10d, drink fluids, continue regular meds , use otc meds of choice, return if not improving in 5 days, return earlier if worsening

## 2016-02-04 ENCOUNTER — Encounter: Payer: Self-pay | Admitting: Physician Assistant

## 2016-02-04 ENCOUNTER — Ambulatory Visit: Payer: Self-pay | Admitting: Physician Assistant

## 2016-02-04 VITALS — BP 100/70 | HR 105 | Temp 98.2°F

## 2016-02-04 DIAGNOSIS — H00023 Hordeolum internum right eye, unspecified eyelid: Secondary | ICD-10-CM

## 2016-02-04 MED ORDER — FLUCONAZOLE 150 MG PO TABS: 150.0000 mg | ORAL_TABLET | Freq: Every day | ORAL | 0 refills | Status: DC

## 2016-02-04 MED ORDER — AMOXICILLIN-POT CLAVULANATE 875-125 MG PO TABS: 1.0000 | ORAL_TABLET | Freq: Two times a day (BID) | ORAL | 0 refills | Status: DC

## 2016-02-04 NOTE — Progress Notes (Signed)
S: c/o r eye pain, has a "sty" but its been getting worse with warm compresses instead of better, hurts to lie on that side, no fever/chills/v; sx for a few days  O: vitals wnl, nad, perrl eomi, r upper lid with red swollen area, no actual hordeolum noted, area tender to touch, no drainage, neck supple no lymph, lungs c t a, cv rrr  A: internal sty vs blocked duct  P: augmentin 875mg  bid, continue warm compress, pt to call her regular eye doctor for f/u asap

## 2016-05-13 ENCOUNTER — Encounter: Payer: Self-pay | Admitting: Physician Assistant

## 2016-05-13 ENCOUNTER — Ambulatory Visit: Payer: Self-pay | Admitting: Physician Assistant

## 2016-05-13 VITALS — BP 110/80 | HR 105 | Temp 98.2°F

## 2016-05-13 DIAGNOSIS — J069 Acute upper respiratory infection, unspecified: Secondary | ICD-10-CM

## 2016-05-13 DIAGNOSIS — X088XXA Exposure to other specified smoke, fire and flames, initial encounter: Secondary | ICD-10-CM

## 2016-05-13 MED ORDER — AZITHROMYCIN 250 MG PO TABS: ORAL_TABLET | ORAL | 0 refills | Status: DC

## 2016-05-13 MED ORDER — FLUTICASONE-SALMETEROL 230-21 MCG/ACT IN AERO: 2.0000 | INHALATION_SPRAY | Freq: Two times a day (BID) | RESPIRATORY_TRACT | 12 refills | Status: DC

## 2016-05-13 MED ORDER — ALBUTEROL SULFATE HFA 108 (90 BASE) MCG/ACT IN AERS: 2.0000 | INHALATION_SPRAY | Freq: Four times a day (QID) | RESPIRATORY_TRACT | 0 refills | Status: AC | PRN
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
## 2016-06-09 ENCOUNTER — Ambulatory Visit: Payer: Self-pay | Admitting: Physician Assistant

## 2016-06-09 ENCOUNTER — Encounter: Payer: Self-pay | Admitting: Physician Assistant

## 2016-06-09 VITALS — BP 119/70 | HR 115 | Temp 98.3°F

## 2016-06-09 DIAGNOSIS — J208 Acute bronchitis due to other specified organisms: Secondary | ICD-10-CM

## 2016-06-09 DIAGNOSIS — J0120 Acute ethmoidal sinusitis, unspecified: Secondary | ICD-10-CM

## 2016-06-09 MED ORDER — SULFAMETHOXAZOLE-TRIMETHOPRIM 800-160 MG PO TABS: 1.0000 | ORAL_TABLET | Freq: Two times a day (BID) | ORAL | 0 refills | Status: DC

## 2016-06-09 MED ORDER — FLUCONAZOLE 150 MG PO TABS: 150.0000 mg | ORAL_TABLET | Freq: Once | ORAL | 0 refills | Status: AC

## 2016-06-09 MED ORDER — PSEUDOEPH-BROMPHEN-DM 30-2-10 MG/5ML PO SYRP: 5.0000 mL | ORAL_SOLUTION | Freq: Four times a day (QID) | ORAL | 0 refills | Status: DC | PRN

## 2016-06-09 NOTE — Progress Notes (Signed)
   Subjective:cough and chest congestion    Patient ID: Katie Hendricks, female    DOB: [birth year], 35 y.o.   MRN: [id number]  HPI Patient c/o cough, wheezing, and chest congestion for 3 days. Denies fever/chill, or NV/D.  Using inhaler with only mild relief.    Review of Systems Hypothyroidism, Asthma, and RA.    Objective:   Physical Exam Bilateral maxillary guarding with edematous nasl turbinates. Post nasal drainage. Neck supple, lungs with right rhonchi breath sound and wheezing.     Assessment & Plan:Sinusitis and Bronchitiis  Bactrim DS, Bromfed DM, and Diflucan. Follow up 3 days if no improvement.

## 2016-08-03 ENCOUNTER — Ambulatory Visit: Payer: Self-pay | Admitting: Physician Assistant

## 2016-08-03 ENCOUNTER — Encounter: Payer: Self-pay | Admitting: Physician Assistant

## 2016-08-03 VITALS — BP 110/70 | HR 105 | Temp 98.7°F

## 2016-08-03 DIAGNOSIS — J069 Acute upper respiratory infection, unspecified: Secondary | ICD-10-CM

## 2016-08-03 MED ORDER — AMOXICILLIN 875 MG PO TABS: 875.0000 mg | ORAL_TABLET | Freq: Two times a day (BID) | ORAL | 0 refills | Status: DC

## 2016-08-03 MED ORDER — FLUCONAZOLE 150 MG PO TABS: ORAL_TABLET | ORAL | 0 refills | Status: DC

## 2016-08-03 NOTE — Progress Notes (Signed)
S: C/o runny nose and congestion for 3 days, dry cough, cough is better today but head congestion is worse, no fever, chills, bodyaches,  cp/sob, v/d; mucus was green this am   Using otc meds: cold med for htn  O: PE: vitals wnl, nad, perrl eomi, normocephalic, tms dull, nasal mucosa red and swollen, throat injected, neck supple no lymph, lungs c t a, cv rrr, neuro intact  A:  Acute uri   P: drink fluids, continue regular meds , use otc meds of choice, return if not improving in 5 days, return earlier if worsening , amoxil 875 bid x 10d, dilfucan 1 now and 1 in a week

## 2016-11-11 ENCOUNTER — Encounter: Payer: Self-pay | Admitting: Physician Assistant

## 2016-11-11 ENCOUNTER — Ambulatory Visit: Payer: Self-pay | Admitting: Physician Assistant

## 2016-11-11 VITALS — BP 110/70 | HR 98 | Temp 98.5°F

## 2016-11-11 DIAGNOSIS — N390 Urinary tract infection, site not specified: Secondary | ICD-10-CM

## 2016-11-11 DIAGNOSIS — Z299 Encounter for prophylactic measures, unspecified: Secondary | ICD-10-CM

## 2016-11-11 LAB — POCT URINALYSIS DIPSTICK
BILIRUBIN UA: NEGATIVE
Blood, UA: NEGATIVE
GLUCOSE UA: NEGATIVE
Ketones, UA: NEGATIVE
NITRITE UA: POSITIVE
Protein, UA: NEGATIVE
Spec Grav, UA: 1.015 (ref 1.010–1.025)
Urobilinogen, UA: 0.2 E.U./dL
pH, UA: 7 (ref 5.0–8.0)

## 2016-11-11 MED ORDER — BUPROPION HCL ER (XL) 150 MG PO TB24: 150.0000 mg | ORAL_TABLET | Freq: Every day | ORAL | 0 refills | Status: DC

## 2016-11-11 MED ORDER — CIPROFLOXACIN HCL 500 MG PO TABS: 500.0000 mg | ORAL_TABLET | Freq: Two times a day (BID) | ORAL | 0 refills | Status: DC

## 2016-11-11 NOTE — Progress Notes (Signed)
S:  C/o uti sx for 2 days, burning, urgency, frequency, had to hold her urine for over 4 hours due to a test being done at [facility];  denies vaginal discharge, abdominal pain or flank pain:  Remainder ros neg  O:  Vitals wnl, nad, no cva tenderness, ua 1+ leuks, nitrites +  A: uti  P: cipro 500 mg bid x 3d, increase water intake, add cranberry juice, return if not improving in 2 -3 days, return earlier if worsening, discussed pyelonephritis sx, will culture urine

## 2016-11-13 LAB — URINE CULTURE

## 2017-02-09 ENCOUNTER — Ambulatory Visit
Admission: RE | Admit: 2017-02-09 | Discharge: 2017-02-09 | Disposition: A | Discharge status: Home / Self Care | Payer: MEDICAID

## 2017-02-09 ENCOUNTER — Ambulatory Visit
Admission: RE | Admit: 2017-02-09 | Discharge: 2017-02-09 | Disposition: A | Discharge status: Home / Self Care | Payer: MEDICAID | Attending: Audiologist | Admitting: Audiologist

## 2017-02-09 DIAGNOSIS — H938X9 Other specified disorders of ear, unspecified ear: Principal | ICD-10-CM

## 2017-02-09 DIAGNOSIS — M329 Systemic lupus erythematosus, unspecified: Secondary | ICD-10-CM

## 2017-02-09 DIAGNOSIS — H938X3 Other specified disorders of ear, bilateral: Principal | ICD-10-CM

## 2017-02-17 ENCOUNTER — Ambulatory Visit: Admission: RE | Admit: 2017-02-17 | Discharge: 2017-02-17 | Disposition: A | Discharge status: Home / Self Care

## 2017-02-17 DIAGNOSIS — E890 Postprocedural hypothyroidism: Principal | ICD-10-CM

## 2017-02-17 DIAGNOSIS — M329 Systemic lupus erythematosus, unspecified: Secondary | ICD-10-CM

## 2017-02-17 DIAGNOSIS — E669 Obesity, unspecified: Secondary | ICD-10-CM

## 2017-02-18 MED ORDER — ARMOUR THYROID 90 MG TABLET
ORAL_TABLET | Freq: Two times a day (BID) | ORAL | 5 refills | 0.00000 days | Status: CP
Start: 2017-02-18 — End: 2018-03-20

## 2017-03-01 ENCOUNTER — Ambulatory Visit
Admission: RE | Admit: 2017-03-01 | Discharge: 2017-03-01 | Disposition: A | Discharge status: Home / Self Care | Payer: MEDICAID

## 2017-03-01 ENCOUNTER — Ambulatory Visit
Admission: RE | Admit: 2017-03-01 | Discharge: 2017-03-01 | Disposition: A | Discharge status: Home / Self Care | Payer: PRIVATE HEALTH INSURANCE

## 2017-03-01 DIAGNOSIS — Z006 Encounter for examination for normal comparison and control in clinical research program: Principal | ICD-10-CM

## 2017-03-01 DIAGNOSIS — M3219 Other organ or system involvement in systemic lupus erythematosus: Secondary | ICD-10-CM

## 2017-03-01 DIAGNOSIS — H539 Unspecified visual disturbance: Secondary | ICD-10-CM

## 2017-03-17 ENCOUNTER — Ambulatory Visit: Admission: RE | Admit: 2017-03-17 | Discharge: 2017-03-17 | Payer: PRIVATE HEALTH INSURANCE

## 2017-03-17 DIAGNOSIS — Z006 Encounter for examination for normal comparison and control in clinical research program: Principal | ICD-10-CM

## 2017-03-25 MED ORDER — PREDNISONE 10 MG TABLET
ORAL_TABLET | 6 refills | 0 days | Status: CP
Start: 2017-03-25 — End: 2017-04-29

## 2017-03-30 ENCOUNTER — Ambulatory Visit: Admission: RE | Admit: 2017-03-30 | Discharge: 2017-03-30 | Payer: PRIVATE HEALTH INSURANCE

## 2017-03-30 DIAGNOSIS — Z79899 Other long term (current) drug therapy: Secondary | ICD-10-CM

## 2017-03-30 DIAGNOSIS — H539 Unspecified visual disturbance: Secondary | ICD-10-CM

## 2017-03-30 DIAGNOSIS — M3219 Other organ or system involvement in systemic lupus erythematosus: Principal | ICD-10-CM

## 2017-03-31 ENCOUNTER — Ambulatory Visit
Admission: RE | Admit: 2017-03-31 | Discharge: 2017-03-31 | Disposition: A | Discharge status: Home / Self Care | Payer: PRIVATE HEALTH INSURANCE

## 2017-03-31 ENCOUNTER — Ambulatory Visit
Admission: RE | Admit: 2017-03-31 | Discharge: 2017-03-31 | Disposition: A | Discharge status: Home / Self Care | Payer: Commercial Managed Care - PPO

## 2017-03-31 DIAGNOSIS — Z006 Encounter for examination for normal comparison and control in clinical research program: Principal | ICD-10-CM

## 2017-04-07 ENCOUNTER — Ambulatory Visit
Admission: RE | Admit: 2017-04-07 | Discharge: 2017-04-07 | Disposition: A | Discharge status: Home / Self Care | Payer: PRIVATE HEALTH INSURANCE

## 2017-04-07 DIAGNOSIS — Z006 Encounter for examination for normal comparison and control in clinical research program: Principal | ICD-10-CM

## 2017-04-13 ENCOUNTER — Ambulatory Visit: Payer: Self-pay | Admitting: Physician Assistant

## 2017-04-13 VITALS — BP 135/65 | HR 104 | Temp 98.5°F | Resp 16

## 2017-04-13 DIAGNOSIS — J0100 Acute maxillary sinusitis, unspecified: Secondary | ICD-10-CM

## 2017-04-13 MED ORDER — FLUCONAZOLE 150 MG PO TABS: ORAL_TABLET | ORAL | 0 refills | Status: DC

## 2017-04-13 MED ORDER — AMOXICILLIN-POT CLAVULANATE 875-125 MG PO TABS: 1.0000 | ORAL_TABLET | Freq: Two times a day (BID) | ORAL | 0 refills | Status: DC

## 2017-04-13 NOTE — Progress Notes (Signed)
S: C/o runny nose and congestion for 6 days, no fever, chills, cp/sob, v/d; mucus is green and thick, cough is sporadic, some dif breathing, a lot of ear and sinus pressure, c/o of facial and dental pain.   Using otc meds:   O: PE: vitals wnl, nad, perrl eomi, normocephalic, tms dull, nasal mucosa red and swollen, throat injected, neck supple no lymph, lungs c t a, cv rrr, neuro intact  A:  Acute sinusitis   P: drink fluids, continue regular meds , use otc meds of choice, return if not improving in 5 days, return earlier if worsening , augmentin 875 bid x 10d, diflucan

## 2017-04-29 ENCOUNTER — Ambulatory Visit
Admission: RE | Admit: 2017-04-29 | Discharge: 2017-04-29 | Disposition: A | Discharge status: Home / Self Care | Payer: MEDICAID

## 2017-04-29 DIAGNOSIS — E039 Hypothyroidism, unspecified: Secondary | ICD-10-CM

## 2017-04-29 DIAGNOSIS — M3219 Other organ or system involvement in systemic lupus erythematosus: Secondary | ICD-10-CM

## 2017-04-29 DIAGNOSIS — Z006 Encounter for examination for normal comparison and control in clinical research program: Principal | ICD-10-CM

## 2017-04-29 MED ORDER — PREDNISONE 10 MG TABLET
ORAL_TABLET | Freq: Every day | ORAL | 6 refills | 0 days | Status: CP
Start: 2017-04-29 — End: 2017-05-26

## 2017-05-12 ENCOUNTER — Ambulatory Visit: Admission: RE | Admit: 2017-05-12 | Discharge: 2017-05-12 | Payer: MEDICAID

## 2017-05-12 DIAGNOSIS — Z006 Encounter for examination for normal comparison and control in clinical research program: Principal | ICD-10-CM

## 2017-05-26 ENCOUNTER — Ambulatory Visit: Admission: RE | Admit: 2017-05-26 | Discharge: 2017-05-26 | Payer: PRIVATE HEALTH INSURANCE

## 2017-05-26 DIAGNOSIS — M3219 Other organ or system involvement in systemic lupus erythematosus: Secondary | ICD-10-CM

## 2017-05-26 DIAGNOSIS — Z006 Encounter for examination for normal comparison and control in clinical research program: Principal | ICD-10-CM

## 2017-05-26 MED ORDER — PREDNISONE 5 MG TABLET
ORAL_TABLET | 6 refills | 0 days | Status: CP
Start: 2017-05-26 — End: 2017-09-15

## 2017-06-08 ENCOUNTER — Telehealth: Payer: Self-pay | Admitting: Emergency Medicine

## 2017-06-08 NOTE — Telephone Encounter (Signed)
[facility] ref[facility].  Bridget Hartshornhonda Summers, PA-C authorized 11 refills.  Form was signed and faxed back to the pharmacy.

## 2017-06-09 ENCOUNTER — Ambulatory Visit: Admission: RE | Admit: 2017-06-09 | Discharge: 2017-06-09 | Payer: PRIVATE HEALTH INSURANCE

## 2017-06-09 DIAGNOSIS — Z006 Encounter for examination for normal comparison and control in clinical research program: Principal | ICD-10-CM

## 2017-06-23 ENCOUNTER — Institutional Professional Consult (permissible substitution): Admit: 2017-06-23 | Discharge: 2017-06-24 | Payer: PRIVATE HEALTH INSURANCE

## 2017-06-23 DIAGNOSIS — Z006 Encounter for examination for normal comparison and control in clinical research program: Principal | ICD-10-CM

## 2017-07-07 ENCOUNTER — Institutional Professional Consult (permissible substitution): Admit: 2017-07-07 | Discharge: 2017-07-08 | Payer: PRIVATE HEALTH INSURANCE

## 2017-07-07 DIAGNOSIS — Z006 Encounter for examination for normal comparison and control in clinical research program: Principal | ICD-10-CM

## 2017-07-21 ENCOUNTER — Institutional Professional Consult (permissible substitution): Admit: 2017-07-21 | Discharge: 2017-07-22 | Payer: PRIVATE HEALTH INSURANCE

## 2017-07-21 DIAGNOSIS — Z006 Encounter for examination for normal comparison and control in clinical research program: Principal | ICD-10-CM

## 2017-08-18 ENCOUNTER — Institutional Professional Consult (permissible substitution): Admit: 2017-08-18 | Discharge: 2017-08-19 | Payer: PRIVATE HEALTH INSURANCE

## 2017-08-18 DIAGNOSIS — Z006 Encounter for examination for normal comparison and control in clinical research program: Principal | ICD-10-CM

## 2017-08-20 ENCOUNTER — Ambulatory Visit: Admit: 2017-08-20 | Discharge: 2017-08-21 | Payer: PRIVATE HEALTH INSURANCE

## 2017-08-20 DIAGNOSIS — H5213 Myopia, bilateral: Principal | ICD-10-CM

## 2017-08-25 ENCOUNTER — Ambulatory Visit: Payer: Self-pay | Admitting: Family Medicine

## 2017-08-25 VITALS — BP 124/76 | HR 100 | Temp 98.1°F | Resp 20

## 2017-08-25 DIAGNOSIS — J329 Chronic sinusitis, unspecified: Secondary | ICD-10-CM

## 2017-08-25 MED ORDER — AMOXICILLIN-POT CLAVULANATE 875-125 MG PO TABS: 1.0000 | ORAL_TABLET | Freq: Two times a day (BID) | ORAL | 0 refills | Status: AC

## 2017-08-25 NOTE — Progress Notes (Signed)
Subjective: congestion     Katie Hendricks is a 37 y.o. female who presents for evaluation of nasal congestion with green  discharge, right sided HA/facial pressure,  right-sided sore throat, mild nonproductive cough, right-sided ear pain, loss of appetite, and pain in the muscles of the right side of her neck for 2 days. Treatment to date: Nasal saline irrigation, nasal steroid spray, Zyrtec, "every over-the-counter treatment available" according to the patient.  Denies otorrhea, nausea, vomiting, diarrhea, rash, confusion, altered mental status, shortness of breath, wheezing, chest or back pain, difficulty swallowing, body aches, fatigue, fever, chills, sneezing, ocular pruritus/discharge, or initial improvement and then worsening of symptoms.  Patient expresses concern over her immunosuppression and chronic conditions because she reports rapid onset of severe infections in the past.  Patient reports this feels like bacterial sinus infections she has had in the past. History of asthma, which is well controlled with twice daily Advair and albuterol as needed.  Last albuterol use was months ago.  Patient has not had to use it with her current illness. History of recurrent sinus infections.  Patient reports 3-4-year.  Saw ENT last year who recommended daily Zyrtec and intranasal steroid spray, which the patient is compliant with. Medical history: Lupus.  Patient is currently taking 7.5 mg of prednisone daily as long-term treatment in addition of Plaquenil for her lupus. Antibiotic use in the last month: None.   Review of Systems Pertinent items noted in HPI and remainder of comprehensive ROS otherwise negative.     Objective:   Physical Exam General: Awake, alert, and oriented. No acute distress. Well developed, hydrated and nourished. Appears stated age.  Nontoxic appearance. HEENT: PND noted. No erythema, edema or exudates of pharynx or tonsils. No erythema or bulging of TM. TM pinkish gray in color,  translucent and in neutral position with normal landmarks noted. Mild erythema/edema to nasal mucosa.  Sinuses nontender. Supple neck without adenopathy.  Patient endorses mild tenderness to palpation to the proximal portion of the right sternocleidomastoid muscle.  No deformity, swelling, erythema, warmth, lesions, nodules noted.  No swelling, erythema, warmth, or tenderness to mastoid area.  Geographic tongue noted.  Head normocephalic.  Patient reports slight tenderness to the right side of her scalp upon light palpation.  Skin appears normal without nodules, edema, erythema, warmth to touch, or lesions. Cardiac: Heart rate and rhythm are normal.  Patient has a history of a chronic high resting heart rate in the low 100s.  No murmurs, gallops, or rubs are auscultated. S1 and S2 are heard and are of normal intensity.  Respiratory: No signs of respiratory distress. Lungs clear. No tachypnea. Able to speak in full sentences without dyspnea.  Respirations nonlabored, 16 breaths/min noted by provider. Skin: Skin is warm, dry and intact. Appropriate color for ethnicity. No cyanosis noted.  Neuro: No signs of meningeal irritation. Normal range of motion of the neck without nuchal rigidity.  No weakness, paralysis or abnormalities in motor or sensory function noted.  Normal gait.  Memory intact.  Assessment:    viral upper respiratory illness   Sinusitis  Plan:    Discussed diagnosis and treatment of URI. Discussed the diagnosis and treatment of sinusitis. Discussed the importance of avoiding unnecessary antibiotic therapy. Suggested symptomatic OTC remedies. Nasal saline spray for congestion.  Offered referral to a new ENT since patient is dissatisfied with the last provider she saw but the patient would like to choose one herself in the [facility] network for convenience to her rheumatologist. Due  to patient's immunosuppression and history of severe sinus infections in the past, I have prescribed Augmentin  to be filled if the patient develops severe symptoms, "double sickening", or has noted no improvement in 10 days of illness.  Patient extensively educated regarding this in addition to adverse effects of antibiotics. Patient is taking a research drug in a trial for lupus right now, which she describes is a "targeted therapy".  Patient consented to discuss her care with research assistant Awanda MinkJulie Norfleet so that I could be sure my treatment plan would not interfere with [facility] and I was assured that Augmentin would not interact with her current treatment regimen or cause any issues with this patient.  Patient has taken Augmentin in the past and tolerated this well. Discussed red flag symptoms and circumstances with which to return to care.   New Prescriptions   AMOXICILLIN-CLAVULANATE (AUGMENTIN) 875-125 MG TABLET    Take 1 tablet by mouth 2 (two) times daily for 10 days.

## 2017-08-27 ENCOUNTER — Ambulatory Visit
Admit: 2017-08-27 | Discharge: 2017-08-27 | Disposition: A | Discharge status: Home / Self Care | Payer: PRIVATE HEALTH INSURANCE

## 2017-08-27 MED ORDER — VALACYCLOVIR 1 GRAM TABLET
ORAL_TABLET | Freq: Three times a day (TID) | ORAL | 0 refills | 0.00000 days | Status: CP
Start: 2017-08-27 — End: 2017-09-03

## 2017-08-31 ENCOUNTER — Ambulatory Visit
Admit: 2017-08-31 | Discharge: 2017-08-31 | Disposition: A | Discharge status: Home / Self Care | Payer: PRIVATE HEALTH INSURANCE | Attending: Adolescent Medicine

## 2017-08-31 MED ORDER — GABAPENTIN 300 MG CAPSULE
ORAL_CAPSULE | 0 refills | 0 days | Status: CP
Start: 2017-08-31 — End: 2017-09-15

## 2017-08-31 MED ORDER — HYDROCODONE 5 MG-ACETAMINOPHEN 325 MG TABLET
ORAL_TABLET | 0 refills | 0 days | Status: CP
Start: 2017-08-31 — End: 2017-12-08

## 2017-09-15 ENCOUNTER — Institutional Professional Consult (permissible substitution): Admit: 2017-09-15 | Discharge: 2017-09-15 | Payer: PRIVATE HEALTH INSURANCE

## 2017-09-15 ENCOUNTER — Ambulatory Visit: Admit: 2017-09-15 | Discharge: 2017-09-15 | Payer: PRIVATE HEALTH INSURANCE

## 2017-09-15 DIAGNOSIS — Z006 Encounter for examination for normal comparison and control in clinical research program: Principal | ICD-10-CM

## 2017-09-15 DIAGNOSIS — M3219 Other organ or system involvement in systemic lupus erythematosus: Secondary | ICD-10-CM

## 2017-09-15 DIAGNOSIS — M25569 Pain in unspecified knee: Secondary | ICD-10-CM

## 2017-09-15 DIAGNOSIS — M25561 Pain in right knee: Principal | ICD-10-CM

## 2017-09-15 DIAGNOSIS — B028 Zoster with other complications: Secondary | ICD-10-CM

## 2017-09-15 MED ORDER — PREDNISONE 5 MG TABLET
ORAL_TABLET | 6 refills | 0 days | Status: CP
Start: 2017-09-15 — End: 2017-11-09

## 2017-09-15 MED ORDER — GABAPENTIN 300 MG CAPSULE
ORAL_CAPSULE | Freq: Three times a day (TID) | ORAL | 2 refills | 0 days | Status: CP
Start: 2017-09-15 — End: 2017-12-08

## 2017-10-04 MED ORDER — HYDROXYCHLOROQUINE 200 MG TABLET
ORAL_TABLET | Freq: Two times a day (BID) | ORAL | 11 refills | 0.00000 days | Status: CP
Start: 2017-10-04 — End: 2018-09-12

## 2017-10-13 ENCOUNTER — Institutional Professional Consult (permissible substitution): Admit: 2017-10-13 | Discharge: 2017-10-14 | Payer: PRIVATE HEALTH INSURANCE

## 2017-10-13 DIAGNOSIS — Z006 Encounter for examination for normal comparison and control in clinical research program: Principal | ICD-10-CM

## 2017-10-13 DIAGNOSIS — M3219 Other organ or system involvement in systemic lupus erythematosus: Secondary | ICD-10-CM

## 2017-10-13 MED ORDER — PREDNISONE 5 MG TABLET
ORAL_TABLET | Freq: Every day | ORAL | 6 refills | 0 days | Status: CP
Start: 2017-10-13 — End: 2017-11-09

## 2017-10-13 MED ORDER — PREDNISONE 1 MG TABLET
ORAL_TABLET | Freq: Every day | ORAL | 6 refills | 0 days | Status: CP
Start: 2017-10-13 — End: 2017-11-09

## 2017-11-09 ENCOUNTER — Institutional Professional Consult (permissible substitution): Admit: 2017-11-09 | Discharge: 2017-11-10 | Payer: PRIVATE HEALTH INSURANCE

## 2017-11-09 DIAGNOSIS — M3219 Other organ or system involvement in systemic lupus erythematosus: Principal | ICD-10-CM

## 2017-11-09 DIAGNOSIS — Z006 Encounter for examination for normal comparison and control in clinical research program: Secondary | ICD-10-CM

## 2017-11-09 MED ORDER — PREDNISONE 1 MG TABLET
ORAL_TABLET | Freq: Every day | ORAL | 6 refills | 0 days | Status: CP
Start: 2017-11-09 — End: 2017-12-08

## 2017-11-09 MED ORDER — PREDNISONE 5 MG TABLET
ORAL_TABLET | Freq: Every day | ORAL | 6 refills | 0 days | Status: CP
Start: 2017-11-09 — End: 2017-12-08

## 2017-12-08 ENCOUNTER — Institutional Professional Consult (permissible substitution): Admit: 2017-12-08 | Discharge: 2017-12-08 | Payer: PRIVATE HEALTH INSURANCE

## 2017-12-08 DIAGNOSIS — M3219 Other organ or system involvement in systemic lupus erythematosus: Secondary | ICD-10-CM

## 2017-12-08 DIAGNOSIS — Z006 Encounter for examination for normal comparison and control in clinical research program: Principal | ICD-10-CM

## 2017-12-08 DIAGNOSIS — B028 Zoster with other complications: Secondary | ICD-10-CM

## 2017-12-08 MED ORDER — GABAPENTIN 300 MG CAPSULE
ORAL_CAPSULE | 2 refills | 0 days
Start: 2017-12-08 — End: 2018-06-29

## 2017-12-08 MED ORDER — PREDNISONE 5 MG TABLET
ORAL_TABLET | Freq: Every day | ORAL | 6 refills | 0.00000 days | Status: CP
Start: 2017-12-08 — End: 2018-04-27

## 2017-12-16 ENCOUNTER — Ambulatory Visit
Admit: 2017-12-16 | Discharge: 2017-12-17 | Payer: PRIVATE HEALTH INSURANCE | Attending: Family Medicine | Primary: Family Medicine

## 2017-12-16 DIAGNOSIS — E890 Postprocedural hypothyroidism: Secondary | ICD-10-CM

## 2017-12-16 DIAGNOSIS — M329 Systemic lupus erythematosus, unspecified: Principal | ICD-10-CM

## 2017-12-16 DIAGNOSIS — J4520 Mild intermittent asthma, uncomplicated: Secondary | ICD-10-CM

## 2017-12-16 DIAGNOSIS — H6983 Other specified disorders of Eustachian tube, bilateral: Secondary | ICD-10-CM

## 2017-12-16 MED ORDER — FLUTICASONE PROPIONATE 115 MCG-SALMETEROL 21 MCG/ACTUATION HFA INHALER
Freq: Two times a day (BID) | RESPIRATORY_TRACT | 12 refills | 0 days | Status: CP
Start: 2017-12-16 — End: 2018-04-08

## 2017-12-16 MED ORDER — ALBUTEROL SULFATE HFA 90 MCG/ACTUATION AEROSOL INHALER
Freq: Four times a day (QID) | RESPIRATORY_TRACT | 3 refills | 0.00000 days | Status: CP | PRN
Start: 2017-12-16 — End: 2018-04-08

## 2017-12-19 DIAGNOSIS — H6983 Other specified disorders of Eustachian tube, bilateral: Secondary | ICD-10-CM | POA: Insufficient documentation

## 2017-12-19 DIAGNOSIS — H6993 Unspecified Eustachian tube disorder, bilateral: Secondary | ICD-10-CM | POA: Insufficient documentation

## 2018-01-05 ENCOUNTER — Institutional Professional Consult (permissible substitution): Admit: 2018-01-05 | Discharge: 2018-01-05 | Payer: PRIVATE HEALTH INSURANCE

## 2018-01-05 DIAGNOSIS — Z006 Encounter for examination for normal comparison and control in clinical research program: Principal | ICD-10-CM

## 2018-01-20 MED ORDER — GABAPENTIN 300 MG CAPSULE
ORAL_CAPSULE | 2 refills | 0 days | Status: CP
Start: 2018-01-20 — End: 2018-03-15

## 2018-01-21 ENCOUNTER — Ambulatory Visit
Admit: 2018-01-21 | Discharge: 2018-01-22 | Payer: PRIVATE HEALTH INSURANCE | Attending: Family Medicine | Primary: Family Medicine

## 2018-01-21 DIAGNOSIS — N760 Acute vaginitis: Principal | ICD-10-CM

## 2018-01-21 MED ORDER — FLUCONAZOLE 150 MG TABLET
ORAL_TABLET | Freq: Once | ORAL | 2 refills | 0 days | Status: CP
Start: 2018-01-21 — End: 2018-01-21

## 2018-02-02 ENCOUNTER — Institutional Professional Consult (permissible substitution): Admit: 2018-02-02 | Discharge: 2018-02-03 | Payer: PRIVATE HEALTH INSURANCE

## 2018-02-02 DIAGNOSIS — Z006 Encounter for examination for normal comparison and control in clinical research program: Principal | ICD-10-CM

## 2018-02-16 ENCOUNTER — Ambulatory Visit
Admit: 2018-02-16 | Discharge: 2018-02-17 | Payer: PRIVATE HEALTH INSURANCE | Attending: "Endocrinology | Primary: "Endocrinology

## 2018-02-16 DIAGNOSIS — M329 Systemic lupus erythematosus, unspecified: Secondary | ICD-10-CM

## 2018-02-16 DIAGNOSIS — E890 Postprocedural hypothyroidism: Principal | ICD-10-CM

## 2018-03-02 ENCOUNTER — Institutional Professional Consult (permissible substitution): Admit: 2018-03-02 | Discharge: 2018-03-03 | Payer: PRIVATE HEALTH INSURANCE

## 2018-03-02 DIAGNOSIS — Z006 Encounter for examination for normal comparison and control in clinical research program: Principal | ICD-10-CM

## 2018-03-04 ENCOUNTER — Ambulatory Visit: Admit: 2018-03-04 | Discharge: 2018-03-05 | Payer: PRIVATE HEALTH INSURANCE

## 2018-03-04 DIAGNOSIS — Z006 Encounter for examination for normal comparison and control in clinical research program: Principal | ICD-10-CM

## 2018-03-15 ENCOUNTER — Ambulatory Visit
Admit: 2018-03-15 | Discharge: 2018-03-16 | Payer: PRIVATE HEALTH INSURANCE | Attending: Family Medicine | Primary: Family Medicine

## 2018-03-15 DIAGNOSIS — J320 Chronic maxillary sinusitis: Principal | ICD-10-CM

## 2018-03-15 MED ORDER — AMOXICILLIN 875 MG-POTASSIUM CLAVULANATE 125 MG TABLET
ORAL_TABLET | Freq: Two times a day (BID) | ORAL | 0 refills | 0 days | Status: CP
Start: 2018-03-15 — End: 2018-04-11

## 2018-03-21 MED ORDER — ARMOUR THYROID 90 MG TABLET
ORAL_TABLET | Freq: Two times a day (BID) | ORAL | 1 refills | 0.00000 days | Status: CP
Start: 2018-03-21 — End: 2018-07-18

## 2018-03-30 ENCOUNTER — Institutional Professional Consult (permissible substitution): Admit: 2018-03-30 | Discharge: 2018-03-31 | Payer: PRIVATE HEALTH INSURANCE

## 2018-03-30 DIAGNOSIS — Z006 Encounter for examination for normal comparison and control in clinical research program: Principal | ICD-10-CM

## 2018-04-08 ENCOUNTER — Ambulatory Visit
Admit: 2018-04-08 | Discharge: 2018-04-09 | Payer: PRIVATE HEALTH INSURANCE | Attending: Family Medicine | Primary: Family Medicine

## 2018-04-08 DIAGNOSIS — F329 Major depressive disorder, single episode, unspecified: Secondary | ICD-10-CM

## 2018-04-08 DIAGNOSIS — J4521 Mild intermittent asthma with (acute) exacerbation: Secondary | ICD-10-CM

## 2018-04-08 DIAGNOSIS — O289 Unspecified abnormal findings on antenatal screening of mother: Secondary | ICD-10-CM

## 2018-04-08 DIAGNOSIS — F419 Anxiety disorder, unspecified: Principal | ICD-10-CM

## 2018-04-08 DIAGNOSIS — F32A Depression, unspecified: Secondary | ICD-10-CM | POA: Insufficient documentation

## 2018-04-08 MED ORDER — ALBUTEROL SULFATE HFA 90 MCG/ACTUATION AEROSOL INHALER
Freq: Four times a day (QID) | RESPIRATORY_TRACT | 3 refills | 0 days | Status: CP | PRN
Start: 2018-04-08 — End: ?

## 2018-04-08 MED ORDER — FLUTICASONE PROPIONATE 115 MCG-SALMETEROL 21 MCG/ACTUATION HFA INHALER
Freq: Two times a day (BID) | RESPIRATORY_TRACT | 12 refills | 0.00000 days | Status: CP
Start: 2018-04-08 — End: ?

## 2018-04-08 MED ORDER — CITALOPRAM 20 MG TABLET
ORAL_TABLET | Freq: Every day | ORAL | 2 refills | 0 days | Status: CP
Start: 2018-04-08 — End: 2018-07-20

## 2018-04-11 ENCOUNTER — Ambulatory Visit
Admit: 2018-04-11 | Discharge: 2018-04-12 | Payer: PRIVATE HEALTH INSURANCE | Attending: Family Medicine | Primary: Family Medicine

## 2018-04-11 DIAGNOSIS — J40 Bronchitis, not specified as acute or chronic: Principal | ICD-10-CM

## 2018-04-11 MED ORDER — AZITHROMYCIN 250 MG TABLET
ORAL_TABLET | Freq: Every day | ORAL | 0 refills | 0 days | Status: CP
Start: 2018-04-11 — End: 2018-04-17

## 2018-04-13 ENCOUNTER — Institutional Professional Consult (permissible substitution): Admit: 2018-04-13 | Discharge: 2018-04-14 | Payer: PRIVATE HEALTH INSURANCE

## 2018-04-13 DIAGNOSIS — Z006 Encounter for examination for normal comparison and control in clinical research program: Principal | ICD-10-CM

## 2018-04-27 ENCOUNTER — Institutional Professional Consult (permissible substitution): Admit: 2018-04-27 | Discharge: 2018-04-28 | Payer: PRIVATE HEALTH INSURANCE

## 2018-04-27 DIAGNOSIS — Z006 Encounter for examination for normal comparison and control in clinical research program: Principal | ICD-10-CM

## 2018-04-27 DIAGNOSIS — M3219 Other organ or system involvement in systemic lupus erythematosus: Secondary | ICD-10-CM

## 2018-04-27 MED ORDER — PREDNISONE 5 MG TABLET
ORAL_TABLET | Freq: Every day | ORAL | 11 refills | 0 days | Status: CP
Start: 2018-04-27 — End: 2018-06-29

## 2018-05-11 ENCOUNTER — Institutional Professional Consult (permissible substitution): Admit: 2018-05-11 | Discharge: 2018-05-12 | Payer: PRIVATE HEALTH INSURANCE

## 2018-05-11 DIAGNOSIS — Z006 Encounter for examination for normal comparison and control in clinical research program: Principal | ICD-10-CM

## 2018-05-24 ENCOUNTER — Ambulatory Visit
Admission: RE | Admit: 2018-05-24 | Discharge: 2018-05-24 | Disposition: A | Discharge status: Home / Self Care | Payer: Managed Care, Other (non HMO) | Source: Ambulatory Visit | Attending: Emergency Medicine | Admitting: Emergency Medicine

## 2018-05-24 ENCOUNTER — Ambulatory Visit: Payer: Self-pay | Admitting: Emergency Medicine

## 2018-05-24 VITALS — BP 138/68 | HR 68 | Temp 98.2°F | Resp 14

## 2018-05-24 DIAGNOSIS — X58XXXA Exposure to other specified factors, initial encounter: Secondary | ICD-10-CM | POA: Diagnosis not present

## 2018-05-24 DIAGNOSIS — S92912A Unspecified fracture of left toe(s), initial encounter for closed fracture: Secondary | ICD-10-CM | POA: Insufficient documentation

## 2018-05-24 DIAGNOSIS — S99922A Unspecified injury of left foot, initial encounter: Secondary | ICD-10-CM

## 2018-05-24 DIAGNOSIS — S92502A Displaced unspecified fracture of left lesser toe(s), initial encounter for closed fracture: Secondary | ICD-10-CM

## 2018-05-24 NOTE — Progress Notes (Signed)
Subjective: Patient was in good health until Sunday when while walking by a table she injured her left fourth toe.  She has had pain and swelling and discomfort since that time. Objective.  There is swelling involving the left fourth toe which extends to the base of the toe.  There is ecchymoses noted. X-ray reveals a essentially nondisplaced fracture of the proximal phalanx fourth toe. Assessment:. Fracture of left fourth toe. Sure what Buddy taping Postop shoe. She is going to try and use her mother's boot

## 2018-05-24 NOTE — Patient Instructions (Signed)
Keep toe buddy taped. Use postop shoe. Take Tylenol or ibuprofen for pain.    Toe Fracture A toe fracture is a break in one of the toe bones (phalanges). Follow these instructions at home: If you have a cast:  Do not stick anything inside the cast to scratch your skin.  Check the skin around the cast every day. Tell your doctor about any concerns. Do not put lotion on the skin underneath the cast. You may put lotion on dry skin around the edges of the cast.  Do not put pressure on any part of the cast until it is fully hardened. This may take many hours.  Keep the cast clean and dry. Bathing  Do not take baths, swim, or use a hot tub until your doctor says that you can. Ask your doctor if you can take showers. You may only be allowed to take sponge baths for bathing.  If your doctor says that bathing and showering are okay, cover the cast or bandage (dressing) with a watertight plastic bag to protect it from water. Do not let the cast or bandage get wet. Managing pain, stiffness, and swelling  If you do not have a cast, put ice on the injured area if told by your doctor: ? Put ice in a plastic bag. ? Place a towel between your skin and the bag. ? Leave the ice on for 20 minutes, 2-3 times per day.  Move your toes often to avoid stiffness and to lessen swelling.  Raise (elevate) the injured area above the level of your heart while you are sitting or lying down. Driving  Do not drive or use heavy machinery while taking pain medicine.  Do not drive while wearing a cast on a foot that you use for driving. Activity  Return to your normal activities as told by your doctor. Ask your doctor what activities are safe for you.  Perform exercises daily as told by your doctor or therapist. Safety  Do not use your leg to support your body weight until your doctor says that you can. Use crutches or other tools to help you move around as told by your doctor. General instructions  If  your toe was taped to a toe that is next to it (buddy taping), follow your doctor's instructions for changing the gauze and tape. Change it more often: ? If the gauze and tape get wet. If this happens, dry the space between the toes. ? If the gauze and tape are too tight and they cause your toe to become pale or to lose feeling (numb).  Wear a protective shoe as told by your doctor. If you were not given one, wear sturdy shoes that support your foot. Your shoes should not pinch your toes. Your shoes should not fit tightly against your toes.  Do not use any tobacco products, including cigarettes, chewing tobacco, or e-cigarettes. Tobacco can delay bone healing. If you need help quitting, ask your doctor.  Take medicines only as told by your doctor.  Keep all follow-up visits as told by your doctor. This is important. Contact a doctor if:  You have a fever.  Your pain medicine is not helping.  Your toe feels cold.  You lose feeling (have numbness) in your toe.  You still have pain after one week of rest and treatment.  You still have pain after your doctor has said that you can start walking again.  You have pain or tingling in your foot, and it  is not going away.  You have loss of feeling in your foot, and it is not going away. Get help right away if:  You have severe pain.  You have redness or swelling (inflammation) in your toe, and it is getting worse.  You have pain or loss of feeling in your toe, and it is getting worse.  Your toe is blue. This information is not intended to replace advice given to you by your health care provider. Make sure you discuss any questions you have with your health care provider. Document Released: 11/25/2007 Document Revised: 02/10/2016 Document Reviewed: 04/04/2014 Elsevier Interactive Patient Education  [employer].

## 2018-05-25 ENCOUNTER — Institutional Professional Consult (permissible substitution): Admit: 2018-05-25 | Discharge: 2018-05-26 | Payer: PRIVATE HEALTH INSURANCE

## 2018-05-25 ENCOUNTER — Ambulatory Visit
Admit: 2018-05-25 | Discharge: 2018-05-26 | Payer: PRIVATE HEALTH INSURANCE | Attending: "Endocrinology | Primary: "Endocrinology

## 2018-05-25 DIAGNOSIS — Z006 Encounter for examination for normal comparison and control in clinical research program: Principal | ICD-10-CM

## 2018-05-25 DIAGNOSIS — E890 Postprocedural hypothyroidism: Principal | ICD-10-CM

## 2018-05-25 DIAGNOSIS — M329 Systemic lupus erythematosus, unspecified: Secondary | ICD-10-CM

## 2018-06-08 ENCOUNTER — Institutional Professional Consult (permissible substitution): Admit: 2018-06-08 | Discharge: 2018-06-09 | Payer: PRIVATE HEALTH INSURANCE

## 2018-06-08 DIAGNOSIS — Z006 Encounter for examination for normal comparison and control in clinical research program: Principal | ICD-10-CM

## 2018-06-29 ENCOUNTER — Institutional Professional Consult (permissible substitution): Admit: 2018-06-29 | Discharge: 2018-06-30 | Payer: PRIVATE HEALTH INSURANCE

## 2018-06-29 DIAGNOSIS — M3219 Other organ or system involvement in systemic lupus erythematosus: Secondary | ICD-10-CM

## 2018-06-29 DIAGNOSIS — B028 Zoster with other complications: Secondary | ICD-10-CM

## 2018-06-29 DIAGNOSIS — S92912G Unspecified fracture of left toe(s), subsequent encounter for fracture with delayed healing: Secondary | ICD-10-CM

## 2018-06-29 DIAGNOSIS — Z006 Encounter for examination for normal comparison and control in clinical research program: Principal | ICD-10-CM

## 2018-06-29 MED ORDER — PREDNISONE 5 MG TABLET
ORAL_TABLET | Freq: Every day | ORAL | 11 refills | 0 days | Status: CP
Start: 2018-06-29 — End: ?

## 2018-06-29 MED ORDER — GABAPENTIN 300 MG CAPSULE
ORAL_CAPSULE | 6 refills | 0 days | Status: CP
Start: 2018-06-29 — End: 2018-12-01

## 2018-07-06 ENCOUNTER — Institutional Professional Consult (permissible substitution): Admit: 2018-07-06 | Discharge: 2018-07-07 | Payer: PRIVATE HEALTH INSURANCE

## 2018-07-06 DIAGNOSIS — Z006 Encounter for examination for normal comparison and control in clinical research program: Principal | ICD-10-CM

## 2018-07-07 ENCOUNTER — Ambulatory Visit
Admit: 2018-07-07 | Discharge: 2018-07-08 | Payer: PRIVATE HEALTH INSURANCE | Attending: Family Medicine | Primary: Family Medicine

## 2018-07-07 DIAGNOSIS — F419 Anxiety disorder, unspecified: Secondary | ICD-10-CM

## 2018-07-07 DIAGNOSIS — D691 Qualitative platelet defects: Secondary | ICD-10-CM

## 2018-07-07 DIAGNOSIS — F329 Major depressive disorder, single episode, unspecified: Secondary | ICD-10-CM

## 2018-07-07 DIAGNOSIS — N979 Female infertility, unspecified: Secondary | ICD-10-CM

## 2018-07-07 DIAGNOSIS — M329 Systemic lupus erythematosus, unspecified: Secondary | ICD-10-CM

## 2018-07-07 DIAGNOSIS — T50905A Adverse effect of unspecified drugs, medicaments and biological substances, initial encounter: Principal | ICD-10-CM

## 2018-07-07 MED ORDER — CYPROHEPTADINE 4 MG TABLET
ORAL_TABLET | Freq: Three times a day (TID) | ORAL | 1 refills | 0.00000 days | Status: CP | PRN
Start: 2018-07-07 — End: 2018-08-06

## 2018-07-19 MED ORDER — ARMOUR THYROID 90 MG TABLET
ORAL_TABLET | Freq: Two times a day (BID) | ORAL | 1 refills | 0.00000 days | Status: CP
Start: 2018-07-19 — End: 2019-01-15

## 2018-07-20 MED ORDER — FLUOXETINE 20 MG CAPSULE
ORAL_CAPSULE | Freq: Every day | ORAL | 2 refills | 0.00000 days | Status: CP
Start: 2018-07-20 — End: 2018-10-26

## 2018-07-28 ENCOUNTER — Institutional Professional Consult (permissible substitution): Admit: 2018-07-28 | Discharge: 2018-07-29 | Payer: PRIVATE HEALTH INSURANCE

## 2018-07-28 DIAGNOSIS — Z006 Encounter for examination for normal comparison and control in clinical research program: Principal | ICD-10-CM

## 2018-08-23 ENCOUNTER — Ambulatory Visit: Admit: 2018-08-23 | Discharge: 2018-08-24 | Payer: PRIVATE HEALTH INSURANCE

## 2018-08-23 DIAGNOSIS — M329 Systemic lupus erythematosus, unspecified: Principal | ICD-10-CM

## 2018-08-23 DIAGNOSIS — H5213 Myopia, bilateral: Principal | ICD-10-CM

## 2018-08-23 DIAGNOSIS — F329 Major depressive disorder, single episode, unspecified: Principal | ICD-10-CM

## 2018-08-23 DIAGNOSIS — L989 Disorder of the skin and subcutaneous tissue, unspecified: Principal | ICD-10-CM

## 2018-08-23 DIAGNOSIS — M359 Systemic involvement of connective tissue, unspecified: Principal | ICD-10-CM

## 2018-08-23 DIAGNOSIS — T300 Burn of unspecified body region, unspecified degree: Principal | ICD-10-CM

## 2018-08-23 DIAGNOSIS — E669 Obesity, unspecified: Principal | ICD-10-CM

## 2018-08-23 DIAGNOSIS — D649 Anemia, unspecified: Principal | ICD-10-CM

## 2018-08-23 DIAGNOSIS — M419 Scoliosis, unspecified: Principal | ICD-10-CM

## 2018-08-23 DIAGNOSIS — F419 Anxiety disorder, unspecified: Principal | ICD-10-CM

## 2018-08-23 DIAGNOSIS — N979 Female infertility, unspecified: Principal | ICD-10-CM

## 2018-08-23 DIAGNOSIS — I319 Disease of pericardium, unspecified: Principal | ICD-10-CM

## 2018-08-23 DIAGNOSIS — D691 Qualitative platelet defects: Principal | ICD-10-CM

## 2018-08-23 DIAGNOSIS — E611 Iron deficiency: Principal | ICD-10-CM

## 2018-08-23 DIAGNOSIS — J45909 Unspecified asthma, uncomplicated: Principal | ICD-10-CM

## 2018-08-23 DIAGNOSIS — K219 Gastro-esophageal reflux disease without esophagitis: Principal | ICD-10-CM

## 2018-08-23 DIAGNOSIS — O09299 Supervision of pregnancy with other poor reproductive or obstetric history, unspecified trimester: Principal | ICD-10-CM

## 2018-08-23 DIAGNOSIS — E063 Autoimmune thyroiditis: Principal | ICD-10-CM

## 2018-08-23 DIAGNOSIS — K929 Disease of digestive system, unspecified: Principal | ICD-10-CM

## 2018-09-09 ENCOUNTER — Ambulatory Visit: Admit: 2018-09-09 | Discharge: 2018-09-10 | Payer: PRIVATE HEALTH INSURANCE

## 2018-09-12 DIAGNOSIS — M3219 Other organ or system involvement in systemic lupus erythematosus: Principal | ICD-10-CM

## 2018-09-12 MED ORDER — HYDROXYCHLOROQUINE 200 MG TABLET
4 refills | 0 days | Status: CP
Start: 2018-09-12 — End: 2018-09-19

## 2018-09-19 DIAGNOSIS — M3219 Other organ or system involvement in systemic lupus erythematosus: Principal | ICD-10-CM

## 2018-09-19 MED ORDER — HYDROXYCHLOROQUINE 200 MG TABLET
Freq: Two times a day (BID) | ORAL | 4 refills | 0.00000 days | Status: CP
Start: 2018-09-19 — End: ?

## 2018-09-30 MED ORDER — PREDNISONE 10 MG TABLET
ORAL_TABLET | 0 refills | 0 days | Status: CP
Start: 2018-09-30 — End: 2018-10-12

## 2018-10-13 MED ORDER — PREDNISONE 10 MG TABLET
ORAL_TABLET | 0 refills | 0 days | Status: CP
Start: 2018-10-13 — End: 2018-10-25

## 2018-10-13 MED ORDER — MYCOPHENOLATE MOFETIL 250 MG CAPSULE
ORAL_CAPSULE | 6 refills | 0 days | Status: CP
Start: 2018-10-13 — End: 2018-10-14

## 2018-10-14 MED ORDER — MYCOPHENOLATE MOFETIL 250 MG CAPSULE
ORAL_CAPSULE | 6 refills | 0 days | Status: CP
Start: 2018-10-14 — End: 2018-11-04

## 2018-10-24 ENCOUNTER — Ambulatory Visit: Admit: 2018-10-24 | Discharge: 2018-10-25 | Payer: PRIVATE HEALTH INSURANCE

## 2018-10-24 DIAGNOSIS — Z79899 Other long term (current) drug therapy: Principal | ICD-10-CM

## 2018-10-24 DIAGNOSIS — H04123 Dry eye syndrome of bilateral lacrimal glands: Secondary | ICD-10-CM

## 2018-10-26 ENCOUNTER — Ambulatory Visit
Admit: 2018-10-26 | Discharge: 2018-10-27 | Payer: PRIVATE HEALTH INSURANCE | Attending: Family Medicine | Primary: Family Medicine

## 2018-10-26 DIAGNOSIS — B379 Candidiasis, unspecified: Principal | ICD-10-CM

## 2018-10-26 MED ORDER — FLUCONAZOLE 150 MG TABLET
ORAL_TABLET | Freq: Once | ORAL | 2 refills | 0 days | Status: CP
Start: 2018-10-26 — End: 2018-10-26

## 2018-10-31 ENCOUNTER — Ambulatory Visit: Admit: 2018-10-31 | Discharge: 2018-11-01 | Payer: PRIVATE HEALTH INSURANCE

## 2018-11-03 MED ORDER — PREDNISONE 5 MG TABLET
ORAL_TABLET | 0 refills | 0 days | Status: CP
Start: 2018-11-03 — End: ?

## 2018-11-04 MED ORDER — AZATHIOPRINE 50 MG TABLET
ORAL_TABLET | Freq: Every day | ORAL | 1 refills | 0.00000 days | Status: CP
Start: 2018-11-04 — End: 2018-11-07

## 2018-11-07 MED ORDER — AZATHIOPRINE 50 MG TABLET
ORAL_TABLET | Freq: Every day | ORAL | 6 refills | 0.00000 days | Status: CP
Start: 2018-11-07 — End: 2018-12-01

## 2018-11-21 ENCOUNTER — Ambulatory Visit
Admit: 2018-11-21 | Discharge: 2018-11-22 | Payer: PRIVATE HEALTH INSURANCE | Attending: Mental Health | Primary: Mental Health

## 2018-12-01 ENCOUNTER — Institutional Professional Consult (permissible substitution): Admit: 2018-12-01 | Discharge: 2018-12-02 | Payer: PRIVATE HEALTH INSURANCE

## 2018-12-01 DIAGNOSIS — M3219 Other organ or system involvement in systemic lupus erythematosus: Secondary | ICD-10-CM

## 2018-12-01 DIAGNOSIS — Z006 Encounter for examination for normal comparison and control in clinical research program: Principal | ICD-10-CM

## 2018-12-01 MED ORDER — PREDNISONE 5 MG TABLET
ORAL_TABLET | 3 refills | 0 days | Status: CP
Start: 2018-12-01 — End: ?

## 2018-12-01 MED ORDER — PREDNISONE 1 MG TABLET
ORAL_TABLET | 3 refills | 0 days | Status: CP
Start: 2018-12-01 — End: ?

## 2018-12-13 ENCOUNTER — Ambulatory Visit: Admit: 2018-12-13 | Discharge: 2018-12-14 | Payer: PRIVATE HEALTH INSURANCE

## 2018-12-30 ENCOUNTER — Ambulatory Visit
Admit: 2018-12-30 | Discharge: 2018-12-31 | Payer: PRIVATE HEALTH INSURANCE | Attending: Family Medicine | Primary: Family Medicine

## 2018-12-30 DIAGNOSIS — F419 Anxiety disorder, unspecified: Principal | ICD-10-CM

## 2018-12-30 DIAGNOSIS — F902 Attention-deficit hyperactivity disorder, combined type: Secondary | ICD-10-CM

## 2018-12-30 DIAGNOSIS — R61 Generalized hyperhidrosis: Secondary | ICD-10-CM

## 2018-12-30 DIAGNOSIS — F329 Major depressive disorder, single episode, unspecified: Secondary | ICD-10-CM

## 2018-12-30 MED ORDER — GLYCOPYRROLATE 1 MG TABLET
ORAL_TABLET | 2 refills | 0 days | Status: CP
Start: 2018-12-30 — End: ?

## 2019-01-23 ENCOUNTER — Ambulatory Visit: Admit: 2019-01-23 | Discharge: 2019-01-24 | Payer: PRIVATE HEALTH INSURANCE

## 2019-02-15 ENCOUNTER — Telehealth
Admit: 2019-02-15 | Discharge: 2019-02-16 | Payer: PRIVATE HEALTH INSURANCE | Attending: "Endocrinology | Primary: "Endocrinology

## 2019-02-15 DIAGNOSIS — E890 Postprocedural hypothyroidism: Principal | ICD-10-CM

## 2019-02-15 DIAGNOSIS — M329 Systemic lupus erythematosus, unspecified: Secondary | ICD-10-CM

## 2019-02-15 DIAGNOSIS — E669 Obesity, unspecified: Secondary | ICD-10-CM

## 2019-03-16 ENCOUNTER — Ambulatory Visit
Admit: 2019-03-16 | Discharge: 2019-03-17 | Payer: PRIVATE HEALTH INSURANCE | Attending: Family Medicine | Primary: Family Medicine

## 2019-03-16 DIAGNOSIS — E063 Autoimmune thyroiditis: Secondary | ICD-10-CM

## 2019-03-16 DIAGNOSIS — F902 Attention-deficit hyperactivity disorder, combined type: Secondary | ICD-10-CM

## 2019-03-16 DIAGNOSIS — M328 Other forms of systemic lupus erythematosus: Secondary | ICD-10-CM

## 2019-03-16 DIAGNOSIS — M4122 Other idiopathic scoliosis, cervical region: Secondary | ICD-10-CM

## 2019-03-16 DIAGNOSIS — Z131 Encounter for screening for diabetes mellitus: Secondary | ICD-10-CM

## 2019-03-16 DIAGNOSIS — E038 Other specified hypothyroidism: Secondary | ICD-10-CM

## 2019-03-16 DIAGNOSIS — Z0000 Encounter for general adult medical examination without abnormal findings: Secondary | ICD-10-CM

## 2019-03-16 DIAGNOSIS — F329 Major depressive disorder, single episode, unspecified: Secondary | ICD-10-CM

## 2019-03-16 DIAGNOSIS — F419 Anxiety disorder, unspecified: Principal | ICD-10-CM

## 2019-03-16 DIAGNOSIS — Z1159 Encounter for screening for other viral diseases: Secondary | ICD-10-CM

## 2019-03-16 DIAGNOSIS — D691 Qualitative platelet defects: Secondary | ICD-10-CM

## 2019-03-21 ENCOUNTER — Ambulatory Visit: Admit: 2019-03-21 | Discharge: 2019-03-22

## 2019-04-12 ENCOUNTER — Telehealth
Admit: 2019-04-12 | Discharge: 2019-04-13 | Payer: PRIVATE HEALTH INSURANCE | Attending: Family Medicine | Primary: Family Medicine

## 2019-04-12 MED ORDER — DOXYCYCLINE HYCLATE 100 MG TABLET: 100 mg | tablet | Freq: Two times a day (BID) | 0 refills | 10 days | Status: AC

## 2019-04-12 MED ORDER — PREDNISONE 5 MG TABLET: tablet | 0 refills | 0 days

## 2019-04-12 MED ORDER — FLUCONAZOLE 150 MG TABLET: 150 mg | tablet | Freq: Once | 1 refills | 1 days | Status: AC

## 2019-04-30 DIAGNOSIS — E890 Postprocedural hypothyroidism: Principal | ICD-10-CM

## 2019-05-01 ENCOUNTER — Ambulatory Visit: Admit: 2019-05-01 | Discharge: 2019-05-02

## 2019-05-01 MED ORDER — ARMOUR THYROID 90 MG TABLET
ORAL_TABLET | 3 refills | 0 days | Status: CP
Start: 2019-05-01 — End: ?

## 2019-05-03 DIAGNOSIS — H9319 Tinnitus, unspecified ear: Principal | ICD-10-CM

## 2019-05-04 ENCOUNTER — Ambulatory Visit
Admit: 2019-05-04 | Discharge: 2019-05-05 | Disposition: A | Discharge status: Home / Self Care | Payer: PRIVATE HEALTH INSURANCE | Attending: Emergency Medicine

## 2019-05-04 ENCOUNTER — Emergency Department
Admit: 2019-05-04 | Discharge: 2019-05-05 | Disposition: A | Discharge status: Home / Self Care | Payer: PRIVATE HEALTH INSURANCE | Attending: Emergency Medicine

## 2019-05-04 DIAGNOSIS — H9319 Tinnitus, unspecified ear: Principal | ICD-10-CM

## 2019-05-04 DIAGNOSIS — R51 Headache: Principal | ICD-10-CM

## 2019-05-06 DIAGNOSIS — H9313 Tinnitus, bilateral: Principal | ICD-10-CM

## 2019-05-11 ENCOUNTER — Ambulatory Visit
Admit: 2019-05-11 | Discharge: 2019-05-12 | Payer: PRIVATE HEALTH INSURANCE | Attending: Family Medicine | Primary: Family Medicine

## 2019-05-11 DIAGNOSIS — F329 Major depressive disorder, single episode, unspecified: Secondary | ICD-10-CM

## 2019-05-11 DIAGNOSIS — J4520 Mild intermittent asthma, uncomplicated: Principal | ICD-10-CM

## 2019-05-11 DIAGNOSIS — G43401 Hemiplegic migraine, not intractable, with status migrainosus: Principal | ICD-10-CM

## 2019-05-11 DIAGNOSIS — F902 Attention-deficit hyperactivity disorder, combined type: Principal | ICD-10-CM

## 2019-05-11 DIAGNOSIS — M3219 Other organ or system involvement in systemic lupus erythematosus: Principal | ICD-10-CM

## 2019-05-11 DIAGNOSIS — G4459 Other complicated headache syndrome: Principal | ICD-10-CM

## 2019-05-11 DIAGNOSIS — F419 Anxiety disorder, unspecified: Secondary | ICD-10-CM

## 2019-05-11 DIAGNOSIS — E038 Other specified hypothyroidism: Principal | ICD-10-CM

## 2019-05-11 MED ORDER — TOPIRAMATE 25 MG TABLET
ORAL_TABLET | Freq: Every evening | ORAL | 2 refills | 60.00000 days | Status: CP
Start: 2019-05-11 — End: 2020-05-10

## 2019-06-05 DIAGNOSIS — M3219 Other organ or system involvement in systemic lupus erythematosus: Principal | ICD-10-CM

## 2019-06-26 DIAGNOSIS — H9319 Tinnitus, unspecified ear: Principal | ICD-10-CM

## 2019-06-27 ENCOUNTER — Ambulatory Visit: Admit: 2019-06-27 | Discharge: 2019-06-27 | Payer: PRIVATE HEALTH INSURANCE

## 2019-06-27 ENCOUNTER — Institutional Professional Consult (permissible substitution)
Admit: 2019-06-27 | Discharge: 2019-06-27 | Payer: PRIVATE HEALTH INSURANCE | Attending: Audiologist | Primary: Audiologist

## 2019-06-27 DIAGNOSIS — H9313 Tinnitus, bilateral: Principal | ICD-10-CM

## 2019-06-27 DIAGNOSIS — H93A3 Pulsatile tinnitus, bilateral: Principal | ICD-10-CM

## 2019-06-27 DIAGNOSIS — H938X3 Other specified disorders of ear, bilateral: Principal | ICD-10-CM

## 2019-06-27 DIAGNOSIS — H838X3 Other specified diseases of inner ear, bilateral: Principal | ICD-10-CM

## 2019-06-27 DIAGNOSIS — H9319 Tinnitus, unspecified ear: Principal | ICD-10-CM

## 2019-06-27 DIAGNOSIS — R42 Dizziness and giddiness: Principal | ICD-10-CM

## 2019-07-01 DIAGNOSIS — M3219 Other organ or system involvement in systemic lupus erythematosus: Principal | ICD-10-CM

## 2019-07-04 ENCOUNTER — Ambulatory Visit: Admit: 2019-07-04 | Discharge: 2019-07-05

## 2019-07-07 DIAGNOSIS — M3219 Other organ or system involvement in systemic lupus erythematosus: Principal | ICD-10-CM

## 2019-07-07 MED ORDER — PREDNISONE 5 MG TABLET
ORAL_TABLET | 11 refills | 0 days | Status: CP
Start: 2019-07-07 — End: ?

## 2019-07-11 ENCOUNTER — Ambulatory Visit: Admit: 2019-07-11 | Discharge: 2019-07-12 | Payer: PRIVATE HEALTH INSURANCE

## 2019-07-12 ENCOUNTER — Telehealth: Admit: 2019-07-12 | Discharge: 2019-07-13 | Payer: PRIVATE HEALTH INSURANCE | Attending: Family | Primary: Family

## 2019-07-13 DIAGNOSIS — G4440 Drug-induced headache, not elsewhere classified, not intractable: Principal | ICD-10-CM

## 2019-07-26 DIAGNOSIS — G43401 Hemiplegic migraine, not intractable, with status migrainosus: Principal | ICD-10-CM

## 2019-07-26 MED ORDER — RIMEGEPANT 75 MG DISINTEGRATING TABLET
ORAL_TABLET | 3 refills | 0 days | Status: CP
Start: 2019-07-26 — End: ?

## 2019-08-10 ENCOUNTER — Ambulatory Visit: Admit: 2019-08-10 | Discharge: 2019-08-11

## 2019-08-30 DIAGNOSIS — M3219 Other organ or system involvement in systemic lupus erythematosus: Principal | ICD-10-CM

## 2019-08-30 MED ORDER — PREDNISONE 5 MG TABLET
ORAL_TABLET | ORAL | 11 refills | 0.00000 days | Status: CP
Start: 2019-08-30 — End: ?

## 2019-09-02 ENCOUNTER — Ambulatory Visit
Admission: EM | Admit: 2019-09-02 | Discharge: 2019-09-02 | Disposition: A | Discharge status: Home / Self Care | Payer: Managed Care, Other (non HMO) | Attending: Family Medicine | Admitting: Family Medicine

## 2019-09-02 ENCOUNTER — Other Ambulatory Visit: Payer: Self-pay

## 2019-09-02 ENCOUNTER — Ambulatory Visit (INDEPENDENT_AMBULATORY_CARE_PROVIDER_SITE_OTHER): Payer: Managed Care, Other (non HMO)

## 2019-09-02 DIAGNOSIS — M79671 Pain in right foot: Secondary | ICD-10-CM | POA: Diagnosis not present

## 2019-09-02 DIAGNOSIS — S60211A Contusion of right wrist, initial encounter: Secondary | ICD-10-CM

## 2019-09-02 DIAGNOSIS — M79641 Pain in right hand: Secondary | ICD-10-CM | POA: Diagnosis not present

## 2019-09-02 DIAGNOSIS — S6391XA Sprain of unspecified part of right wrist and hand, initial encounter: Secondary | ICD-10-CM

## 2019-09-02 DIAGNOSIS — S9031XA Contusion of right foot, initial encounter: Secondary | ICD-10-CM | POA: Diagnosis not present

## 2019-09-02 DIAGNOSIS — W010XXA Fall on same level from slipping, tripping and stumbling without subsequent striking against object, initial encounter: Secondary | ICD-10-CM

## 2019-09-02 DIAGNOSIS — S63501A Unspecified sprain of right wrist, initial encounter: Secondary | ICD-10-CM

## 2019-09-02 DIAGNOSIS — M25531 Pain in right wrist: Secondary | ICD-10-CM | POA: Diagnosis not present

## 2019-09-02 DIAGNOSIS — S93601A Unspecified sprain of right foot, initial encounter: Secondary | ICD-10-CM

## 2019-09-02 HISTORY — DX: Migraine, unspecified, not intractable, without status migrainosus: G43.909

## 2019-09-02 HISTORY — DX: Other specified diseases of inner ear, bilateral: H83.8X3

## 2019-09-02 HISTORY — DX: Systemic lupus erythematosus, unspecified: M32.9

## 2019-09-02 HISTORY — DX: Disorder of thyroid, unspecified: E07.9

## 2019-09-02 MED ORDER — HYDROCODONE-ACETAMINOPHEN 5-325 MG PO TABS: ORAL_TABLET | ORAL | 0 refills | Status: AC

## 2019-09-02 NOTE — Discharge Instructions (Addendum)
Rest, ice, elevation, over the counter tylenol/advil

## 2019-09-02 NOTE — ED Triage Notes (Signed)
Pt presents with c/o fall at home today. She reports right wrist pain and right toe pain.

## 2019-09-02 NOTE — ED Provider Notes (Signed)
[facility]    CSN: [id number] Arrival date & time: 09/02/19  1602      History   Chief Complaint Chief Complaint  Patient presents with  . Wrist Pain    right  . Toe Pain    right, 3rd    HPI Katie Hendricks is a 39 y.o. female.   39 yo female with a c/o right wrist and foot pain and swelling after slipping and falling at home today. Denies hitting her head or loss of consciousness.    Wrist Pain  Toe Pain    Past Medical History:  Diagnosis Date  . Lupus ([place])   . Migraine   . Superior semicircular canal dehiscence, bilateral   . Thyroid disease     Patient Active Problem List   Diagnosis Date Noted  . Anxiety and depression 04/08/2018  . Eustachian tube dysfunction, bilateral 12/19/2017  . 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 Pericarditis 12/30/2014  . Asthma, mild intermittent 11/29/2012  . SLE (systemic lupus erythematosus) ([place]) 10/25/2012  . Hypothyroidism 09/14/2012    Past Surgical History:  Procedure Laterality Date  . ABDOMINAL HYSTERECTOMY    . CESAREAN SECTION    . FOOT SURGERY    . THYROIDECTOMY      OB History   No obstetric history on file.      Home Medications    Prior to Admission medications   Medication Sig Start Date End Date Taking? Authorizing Provider  albuterol (PROVENTIL HFA;VENTOLIN HFA) 108 (90 Base) MCG/ACT inhaler Inhale 2 puffs into the lungs every 6 (six) hours as needed for wheezing or shortness of breath. 05/13/16  Yes Fisher, Linden Dolin, PA-C  Calcium-Magnesium-Vitamin D (CALCIUM+D3 GRADUAL RELEASE) 600-40-500 MG-MG-UNIT TB24 Take by mouth. 04/28/12  Yes [provider]  Dexmethylphenidate HCl 30 MG CP24 Take 1 tablet by mouth daily. 08/21/19  Yes [provider]  FLUoxetine (PROZAC) 40 MG capsule Take 40 mg by mouth daily. 08/31/19  Yes [provider]  hydroxychloroquine (PLAQUENIL) 200 MG tablet Take 200 mg by mouth 2 (two) times daily. 07/03/19  Yes [provider]  NURTEC 75 MG TBDP Take 1 tablet by mouth as needed. 07/26/19  Yes [provider]  omeprazole (PRILOSEC) 40 MG capsule Take 40 mg by mouth. 03/26/15 09/02/19 Yes [provider]  predniSONE (DELTASONE) 5 MG tablet Take 1 tablet by mouth in the morning and at bedtime. 07/07/19  Yes [provider]  thyroid (ARMOUR THYROID) 90 MG tablet TAKE ONE (90MG  TOTAL) TABLET BY MOUTH TWICE DAILY AS DIRECTED BY PHYSICIAN. 02/18/15  Yes [provider]  traZODone (DESYREL) 50 MG tablet Take 1 tablet by mouth at bedtime. 07/27/18  Yes [provider]  citalopram (CELEXA) 20 MG tablet Take by mouth. 04/08/18 04/08/19  [provider]  gabapentin (NEURONTIN) 300 MG capsule Two tablets at night 12/08/17   [provider]  HYDROcodone-acetaminophen (NORCO/VICODIN) 5-325 MG tablet 1-2 tabs po bid prn 09/02/19   Norval Gable, MD  Potassium Gluconate ([employer] POTASSIUM GLUCONATE) 2 MEQ TABS Take by mouth. 04/28/12   [provider]    Family History Family History  Problem Relation Age of Onset  . Healthy Mother   . Diabetes Father     Social History Social History   Tobacco Use  . Smoking status: Never Smoker  . Smokeless tobacco: Never Used  Substance Use Topics  . Alcohol use: Not Currently    Alcohol/week: 0.0 standard drinks  . Drug  use: Never     Allergies   Etodolac, Adalimumab, Etanercept, Methotrexate, and Tape   Review of Systems Review of Systems   Physical Exam Triage Vital Signs ED Triage Vitals  Enc Vitals Group     BP 09/02/19 1618 132/89     Pulse Rate 09/02/19 1618 90     Resp 09/02/19 1618 18     Temp 09/02/19 1618 98.1 F (36.7 C)     Temp Source 09/02/19 1618 Oral     SpO2 09/02/19 1618 99 %     Weight 09/02/19 1611 190 lb (86.2 kg)     Height 09/02/19 1611 5\' 9"  (1.753 m)     Head Circumference --      Peak Flow --      Pain Score 09/02/19 1611 8     Pain Loc --      Pain Edu? --       Excl. in GC? --    No data found.  Updated Vital Signs BP 132/89 (BP Location: Left Arm)   Pulse 90   Temp 98.1 F (36.7 C) (Oral)   Resp 18   Ht 5\' 9"  (1.753 m)   Wt 86.2 kg   SpO2 99%   BMI 28.06 kg/m   Visual Acuity Right Eye Distance:   Left Eye Distance:   Bilateral Distance:    Right Eye Near:   Left Eye Near:    Bilateral Near:     Physical Exam Vitals and nursing note reviewed.  Constitutional:      General: She is not in acute distress.    Appearance: She is not toxic-appearing or diaphoretic.  Musculoskeletal:     Right wrist: Swelling, deformity, tenderness and bony tenderness present. No effusion, lacerations, snuff box tenderness or crepitus. Decreased range of motion. Normal pulse.     Right hand: Swelling and bony tenderness present. No lacerations. Decreased range of motion. Decreased strength. Normal sensation. There is no disruption of two-point discrimination. Normal capillary refill. Normal pulse.     Right foot: Normal capillary refill. Swelling, tenderness and bony tenderness present. No deformity, Charcot foot, foot drop, laceration or crepitus. Normal pulse.     Comments: Right upper and lower extremities neurovascularly intact  Neurological:     Mental Status: She is alert.      UC Treatments / Results  Labs (all labs ordered are listed, but only abnormal results are displayed) Labs Reviewed - No data to display  EKG   Radiology DG Wrist Complete Right  Result Date: 09/02/2019 CLINICAL DATA:  Pain after fall. Fifth metacarpal/digit pain, right wrist pain. Right third toe pain. EXAM: RIGHT WRIST - COMPLETE 3+ VIEW COMPARISON:  Radiograph 04/06/2011 FINDINGS: There is no evidence of fracture or dislocation. Tiny erosion involving the ulnar aspect of the capitate capitate. Questionable erosion involving the ulnar aspect of the triquetrum. Small cyst in the scaphoid, also present on prior exam. Soft tissues prominence overlying the ulnar aspect  of the wrist with mild edema. IMPRESSION: 1. No acute fracture or dislocation of the right wrist. 2. Soft tissue prominence overlying the ulnar aspect of the wrist with mild edema, can be seen with soft tissue injury. 3. Small erosions about the capitate possibly triquetrum consistent with inflammatory arthropathy as can be seen with lupus. Electronically Signed   By: 09/04/2019 M.D.   On: 09/02/2019 16:47   DG Hand Complete Right  Result Date: 09/02/2019 CLINICAL DATA:  Pain after fall. Fifth metacarpal/digit pain, right wrist  pain. Right third toe pain. EXAM: RIGHT HAND - COMPLETE 3+ VIEW COMPARISON:  Radiograph 04/06/2011 FINDINGS: There is no evidence of fracture or dislocation. Tiny erosion about the fifth metacarpal head. The alignment and joint spaces are maintained. Soft tissues are unremarkable. IMPRESSION: No fracture or dislocation of the right hand. Tiny erosion about the fifth metacarpal head may represent inflammatory arthropathy such as that seen with lupus. Electronically Signed   By: Narda Rutherford M.D.   On: 09/02/2019 16:44   DG Foot Complete Right  Result Date: 09/02/2019 CLINICAL DATA:  Pain after fall. Fifth metacarpal/digit pain, right wrist pain. Right third toe pain. EXAM: RIGHT FOOT COMPLETE - 3+ VIEW COMPARISON:  Radiograph 04/06/2011 FINDINGS: There is no evidence of fracture or dislocation. Alignment and joint spaces are maintained. Well corticated osseous densities adjacent to the medial navicular and likely fragmented ossicles. Small well corticated density projecting about the dorsum of the midfoot, ossicle versus remote prior injury. This appears nonacute. Soft tissues are unremarkable. IMPRESSION: No acute fracture or dislocation of the right foot. Electronically Signed   By: Narda Rutherford M.D.   On: 09/02/2019 16:49    Procedures Procedures (including critical care time)  Medications Ordered in UC Medications - No data to display  Initial Impression /  Assessment and Plan / UC Course  I have reviewed the triage vital signs and the nursing notes.  Pertinent labs & imaging results that were available during my care of the patient were reviewed by me and considered in my medical decision making (see chart for details).      Final Clinical Impressions(s) / UC Diagnoses   Final diagnoses:  Sprain of right wrist, initial encounter  Contusion of right wrist, initial encounter  Hand sprain, right, initial encounter  Contusion of right foot, initial encounter  Sprain of right foot, initial encounter     Discharge Instructions     Rest, ice, elevation, over the counter tylenol/advil     ED Prescriptions    Medication Sig Dispense Auth. Provider   HYDROcodone-acetaminophen (NORCO/VICODIN) 5-325 MG tablet 1-2 tabs po bid prn 8 tablet Terrall Bley, Pamala Hurry, MD      1. x-ray results and diagnosis reviewed with patient 2. rx as per orders above; reviewed possible side effects, interactions, risks and benefits  3. Recommend supportive treatment as above 4. velcro wrist splint  5. Follow-up prn if symptoms worsen or don't improve  I have reviewed the PDMP during this encounter.   Payton Mccallum, MD 09/02/19 [phone]

## 2019-09-05 DIAGNOSIS — M3219 Other organ or system involvement in systemic lupus erythematosus: Principal | ICD-10-CM

## 2019-09-05 MED ORDER — PREDNISONE 5 MG TABLET
ORAL_TABLET | 6 refills | 0 days | Status: CP
Start: 2019-09-05 — End: ?

## 2019-09-05 MED ORDER — PREDNISONE 1 MG TABLET
ORAL_TABLET | 1 refills | 0 days | Status: CP
Start: 2019-09-05 — End: ?

## 2019-09-08 ENCOUNTER — Ambulatory Visit
Admit: 2019-09-08 | Discharge: 2019-09-09 | Payer: PRIVATE HEALTH INSURANCE | Attending: Family Medicine | Primary: Family Medicine

## 2019-09-08 DIAGNOSIS — B029 Zoster without complications: Principal | ICD-10-CM

## 2019-09-08 MED ORDER — GABAPENTIN 300 MG CAPSULE
ORAL_CAPSULE | Freq: Three times a day (TID) | ORAL | 1 refills | 14.00000 days | Status: CP | PRN
Start: 2019-09-08 — End: 2020-09-07

## 2019-09-08 MED ORDER — VALACYCLOVIR 1 GRAM TABLET
ORAL_TABLET | Freq: Three times a day (TID) | ORAL | 0 refills | 7 days | Status: CP
Start: 2019-09-08 — End: 2019-09-15

## 2019-09-14 DIAGNOSIS — G43409 Hemiplegic migraine, not intractable, without status migrainosus: Principal | ICD-10-CM

## 2019-09-14 MED ORDER — VERAPAMIL 120 MG TABLET
ORAL_TABLET | 3 refills | 0 days | Status: CP
Start: 2019-09-14 — End: ?

## 2019-09-19 DIAGNOSIS — M3219 Other organ or system involvement in systemic lupus erythematosus: Principal | ICD-10-CM

## 2019-09-25 DIAGNOSIS — M3219 Other organ or system involvement in systemic lupus erythematosus: Principal | ICD-10-CM

## 2019-09-26 MED ORDER — HYDROXYCHLOROQUINE 200 MG TABLET
ORAL_TABLET | Freq: Two times a day (BID) | ORAL | 6 refills | 90.00000 days | Status: CP
Start: 2019-09-26 — End: ?

## 2019-09-28 ENCOUNTER — Ambulatory Visit: Admit: 2019-09-28 | Discharge: 2019-09-29 | Payer: PRIVATE HEALTH INSURANCE

## 2019-09-28 DIAGNOSIS — H838X3 Other specified diseases of inner ear, bilateral: Principal | ICD-10-CM

## 2019-09-29 ENCOUNTER — Ambulatory Visit: Admit: 2019-09-29 | Discharge: 2019-09-30

## 2019-10-02 ENCOUNTER — Ambulatory Visit: Admit: 2019-10-02 | Discharge: 2019-10-03 | Payer: PRIVATE HEALTH INSURANCE

## 2019-10-30 ENCOUNTER — Telehealth: Admit: 2019-10-30 | Discharge: 2019-10-31 | Payer: PRIVATE HEALTH INSURANCE | Attending: Family | Primary: Family

## 2019-10-30 MED ORDER — RIMEGEPANT 75 MG DISINTEGRATING TABLET
ORAL_TABLET | 3 refills | 0 days | Status: CP
Start: 2019-10-30 — End: ?

## 2019-10-30 MED ORDER — VERAPAMIL 120 MG TABLET
ORAL_TABLET | 3 refills | 0 days | Status: CP
Start: 2019-10-30 — End: ?

## 2019-11-05 DIAGNOSIS — M3219 Other organ or system involvement in systemic lupus erythematosus: Principal | ICD-10-CM

## 2019-11-07 ENCOUNTER — Ambulatory Visit: Admit: 2019-11-07 | Discharge: 2019-11-08 | Payer: PRIVATE HEALTH INSURANCE

## 2019-11-07 DIAGNOSIS — H838X3 Other specified diseases of inner ear, bilateral: Principal | ICD-10-CM

## 2019-11-07 DIAGNOSIS — H93A3 Pulsatile tinnitus, bilateral: Principal | ICD-10-CM

## 2019-11-07 DIAGNOSIS — R42 Dizziness and giddiness: Principal | ICD-10-CM

## 2019-11-07 MED ORDER — PREDNISONE 1 MG TABLET
ORAL_TABLET | ORAL | 1 refills | 0.00000 days | Status: CP
Start: 2019-11-07 — End: ?

## 2019-11-30 MED ORDER — FLUCONAZOLE 150 MG TABLET
ORAL_TABLET | Freq: Once | ORAL | 0 refills | 1.00000 days | Status: CP
Start: 2019-11-30 — End: 2019-11-30

## 2019-12-12 ENCOUNTER — Telehealth
Admit: 2019-12-12 | Discharge: 2019-12-13 | Payer: PRIVATE HEALTH INSURANCE | Attending: Internal Medicine | Primary: Internal Medicine

## 2019-12-14 ENCOUNTER — Ambulatory Visit
Admit: 2019-12-14 | Discharge: 2020-01-12 | Payer: PRIVATE HEALTH INSURANCE | Attending: Audiologist | Primary: Audiologist

## 2019-12-14 DIAGNOSIS — R42 Dizziness and giddiness: Principal | ICD-10-CM

## 2019-12-21 ENCOUNTER — Ambulatory Visit: Admit: 2019-12-21 | Discharge: 2019-12-22

## 2020-01-01 ENCOUNTER — Ambulatory Visit: Admit: 2020-01-01 | Discharge: 2020-01-02

## 2020-01-01 DIAGNOSIS — G43401 Hemiplegic migraine, not intractable, with status migrainosus: Principal | ICD-10-CM

## 2020-01-04 ENCOUNTER — Ambulatory Visit
Admit: 2020-01-04 | Discharge: 2020-01-05 | Payer: PRIVATE HEALTH INSURANCE | Attending: Family Medicine | Primary: Family Medicine

## 2020-01-04 DIAGNOSIS — R21 Rash and other nonspecific skin eruption: Principal | ICD-10-CM

## 2020-01-04 MED ORDER — TRIAMCINOLONE ACETONIDE 0.1 % TOPICAL CREAM
Freq: Two times a day (BID) | TOPICAL | 2 refills | 0.00000 days | Status: CP
Start: 2020-01-04 — End: 2021-01-03

## 2020-01-10 ENCOUNTER — Emergency Department
Admit: 2020-01-10 | Discharge: 2020-01-11 | Disposition: A | Discharge status: Home / Self Care | Payer: PRIVATE HEALTH INSURANCE

## 2020-01-10 ENCOUNTER — Ambulatory Visit
Admit: 2020-01-10 | Discharge: 2020-01-11 | Disposition: A | Discharge status: Home / Self Care | Payer: PRIVATE HEALTH INSURANCE

## 2020-01-10 DIAGNOSIS — S61316A Laceration without foreign body of right little finger with damage to nail, initial encounter: Principal | ICD-10-CM

## 2020-01-17 ENCOUNTER — Ambulatory Visit
Admit: 2020-01-17 | Discharge: 2020-01-17 | Payer: PRIVATE HEALTH INSURANCE | Attending: "Endocrinology | Primary: "Endocrinology

## 2020-01-17 ENCOUNTER — Ambulatory Visit: Admit: 2020-01-17 | Discharge: 2020-01-18 | Payer: PRIVATE HEALTH INSURANCE

## 2020-01-17 ENCOUNTER — Ambulatory Visit: Admit: 2020-01-17 | Discharge: 2020-01-17 | Payer: PRIVATE HEALTH INSURANCE

## 2020-01-17 ENCOUNTER — Ambulatory Visit
Admit: 2020-01-17 | Discharge: 2020-01-17 | Payer: PRIVATE HEALTH INSURANCE | Attending: Family Medicine | Primary: Family Medicine

## 2020-01-17 DIAGNOSIS — M3219 Other organ or system involvement in systemic lupus erythematosus: Principal | ICD-10-CM

## 2020-01-17 DIAGNOSIS — E669 Obesity, unspecified: Principal | ICD-10-CM

## 2020-01-17 DIAGNOSIS — H5213 Myopia, bilateral: Principal | ICD-10-CM

## 2020-01-17 DIAGNOSIS — E890 Postprocedural hypothyroidism: Principal | ICD-10-CM

## 2020-01-17 DIAGNOSIS — Z79899 Other long term (current) drug therapy: Principal | ICD-10-CM

## 2020-01-17 DIAGNOSIS — H04123 Dry eye syndrome of bilateral lacrimal glands: Principal | ICD-10-CM

## 2020-01-17 DIAGNOSIS — E038 Other specified hypothyroidism: Principal | ICD-10-CM

## 2020-01-17 DIAGNOSIS — S61316D Laceration without foreign body of right little finger with damage to nail, subsequent encounter: Principal | ICD-10-CM

## 2020-01-17 MED ORDER — ARMOUR THYROID 90 MG TABLET
ORAL_TABLET | Freq: Two times a day (BID) | ORAL | 3 refills | 90.00000 days | Status: CP
Start: 2020-01-17 — End: 2021-01-11

## 2020-01-17 MED ORDER — PREDNISONE 5 MG TABLET
ORAL_TABLET | ORAL | 6 refills | 0.00000 days | Status: CP
Start: 2020-01-17 — End: ?

## 2020-01-23 ENCOUNTER — Ambulatory Visit: Admit: 2020-01-23 | Discharge: 2020-01-24 | Payer: PRIVATE HEALTH INSURANCE

## 2020-01-23 DIAGNOSIS — R42 Dizziness and giddiness: Principal | ICD-10-CM

## 2020-01-23 DIAGNOSIS — M26629 Arthralgia of temporomandibular joint, unspecified side: Principal | ICD-10-CM

## 2020-01-29 DIAGNOSIS — M3219 Other organ or system involvement in systemic lupus erythematosus: Principal | ICD-10-CM

## 2020-01-31 MED ORDER — PREDNISONE 1 MG TABLET
ORAL_TABLET | ORAL | 1 refills | 0.00000 days | Status: CP
Start: 2020-01-31 — End: ?

## 2020-02-06 DIAGNOSIS — M26629 Arthralgia of temporomandibular joint, unspecified side: Principal | ICD-10-CM

## 2020-02-10 ENCOUNTER — Ambulatory Visit: Admit: 2020-02-10 | Discharge: 2020-02-11 | Payer: PRIVATE HEALTH INSURANCE

## 2020-02-10 DIAGNOSIS — R1010 Upper abdominal pain, unspecified: Principal | ICD-10-CM

## 2020-02-11 MED ORDER — OXYCODONE 5 MG TABLET
ORAL_TABLET | Freq: Four times a day (QID) | ORAL | 0 refills | 3.00000 days | Status: CP | PRN
Start: 2020-02-11 — End: 2020-02-16

## 2020-02-14 ENCOUNTER — Encounter
Admit: 2020-02-14 | Discharge: 2020-02-18 | Payer: PRIVATE HEALTH INSURANCE | Attending: Certified Registered" | Primary: Certified Registered"

## 2020-02-14 ENCOUNTER — Ambulatory Visit: Admit: 2020-02-14 | Discharge: 2020-02-18 | Payer: PRIVATE HEALTH INSURANCE

## 2020-02-18 MED ORDER — CALCIUM CARBONATE 200 MG CALCIUM (500 MG) CHEWABLE TABLET
ORAL_TABLET | Freq: Every day | ORAL | 0 refills | 30 days
Start: 2020-02-18 — End: ?

## 2020-02-18 MED ORDER — OXYCODONE 5 MG TABLET
ORAL_TABLET | Freq: Four times a day (QID) | ORAL | 0 refills | 3 days | Status: CP | PRN
Start: 2020-02-18 — End: 2020-02-23

## 2020-02-18 MED ORDER — ACETAMINOPHEN 500 MG TABLET
ORAL_TABLET | Freq: Three times a day (TID) | ORAL | 0 refills | 5.00000 days | PRN
Start: 2020-02-18 — End: ?

## 2020-02-18 MED ORDER — DICLOFENAC 1 % TOPICAL GEL
Freq: Four times a day (QID) | TOPICAL | 0 refills | 13.00000 days | Status: CP
Start: 2020-02-18 — End: ?

## 2020-02-18 MED ORDER — PREDNISONE 10 MG TABLET
ORAL_TABLET | ORAL | 0 refills | 27 days | Status: CP
Start: 2020-02-18 — End: 2020-03-16

## 2020-02-18 MED ORDER — POLYETHYLENE GLYCOL 3350 17 GRAM/DOSE ORAL POWDER
Freq: Two times a day (BID) | ORAL | 0 refills | 30 days | Status: CP
Start: 2020-02-18 — End: ?

## 2020-02-19 MED ORDER — SULFAMETHOXAZOLE 800 MG-TRIMETHOPRIM 160 MG TABLET
ORAL_TABLET | ORAL | 0 refills | 21 days | Status: CP
Start: 2020-02-19 — End: 2020-03-11

## 2020-02-19 MED ORDER — FERROUS SULFATE 325 MG (65 MG IRON) TABLET
ORAL_TABLET | ORAL | 11 refills | 30.00000 days | Status: CP
Start: 2020-02-19 — End: 2021-02-18

## 2020-02-21 DIAGNOSIS — K861 Other chronic pancreatitis: Principal | ICD-10-CM

## 2020-02-29 ENCOUNTER — Ambulatory Visit
Admit: 2020-02-29 | Discharge: 2020-03-01 | Payer: PRIVATE HEALTH INSURANCE | Attending: Family Medicine | Primary: Family Medicine

## 2020-02-29 ENCOUNTER — Ambulatory Visit: Admit: 2020-02-29 | Discharge: 2020-03-01 | Payer: PRIVATE HEALTH INSURANCE

## 2020-02-29 DIAGNOSIS — M3219 Other organ or system involvement in systemic lupus erythematosus: Principal | ICD-10-CM

## 2020-02-29 DIAGNOSIS — R079 Chest pain, unspecified: Principal | ICD-10-CM

## 2020-02-29 DIAGNOSIS — F419 Anxiety disorder, unspecified: Principal | ICD-10-CM

## 2020-02-29 DIAGNOSIS — R109 Unspecified abdominal pain: Principal | ICD-10-CM

## 2020-02-29 DIAGNOSIS — F329 Major depressive disorder, single episode, unspecified: Principal | ICD-10-CM

## 2020-02-29 MED ORDER — PREDNISONE 10 MG TABLET
ORAL_TABLET | Freq: Every day | ORAL | 0 refills | 100 days | Status: CP
Start: 2020-02-29 — End: 2020-03-21

## 2020-02-29 MED ORDER — OXYCODONE 5 MG CAPSULE
ORAL_CAPSULE | ORAL | 0 refills | 2 days | Status: CP | PRN
Start: 2020-02-29 — End: 2020-03-03

## 2020-03-10 ENCOUNTER — Ambulatory Visit: Admit: 2020-03-10 | Discharge: 2020-03-11 | Payer: PRIVATE HEALTH INSURANCE

## 2020-03-10 DIAGNOSIS — J069 Acute upper respiratory infection, unspecified: Principal | ICD-10-CM

## 2020-03-10 DIAGNOSIS — R05 Cough: Principal | ICD-10-CM

## 2020-03-10 DIAGNOSIS — U071 COVID-19: Principal | ICD-10-CM

## 2020-03-10 MED ORDER — HYDROCODONE 10 MG-CHLORPHENIRAMINE 8 MG/5 ML ORAL SUSP EXTEND.REL 12HR
Freq: Every evening | ORAL | 0 refills | 8.00000 days | Status: CP | PRN
Start: 2020-03-10 — End: ?

## 2020-03-11 ENCOUNTER — Ambulatory Visit
Admit: 2020-03-11 | Discharge: 2020-03-12 | Disposition: A | Discharge status: Home / Self Care | Payer: PRIVATE HEALTH INSURANCE | Attending: Emergency Medicine

## 2020-03-11 ENCOUNTER — Emergency Department
Admit: 2020-03-11 | Discharge: 2020-03-12 | Disposition: A | Discharge status: Home / Self Care | Payer: PRIVATE HEALTH INSURANCE | Attending: Emergency Medicine

## 2020-03-11 ENCOUNTER — Ambulatory Visit: Admit: 2020-03-11 | Discharge: 2020-03-11 | Payer: PRIVATE HEALTH INSURANCE

## 2020-03-11 DIAGNOSIS — U071 COVID-19: Principal | ICD-10-CM

## 2020-03-12 ENCOUNTER — Telehealth: Admit: 2020-03-12 | Discharge: 2020-03-13 | Payer: PRIVATE HEALTH INSURANCE

## 2020-03-12 DIAGNOSIS — R109 Unspecified abdominal pain: Principal | ICD-10-CM

## 2020-03-12 DIAGNOSIS — M3219 Other organ or system involvement in systemic lupus erythematosus: Principal | ICD-10-CM

## 2020-03-12 DIAGNOSIS — R161 Splenomegaly, not elsewhere classified: Principal | ICD-10-CM

## 2020-03-12 DIAGNOSIS — R591 Generalized enlarged lymph nodes: Principal | ICD-10-CM

## 2020-03-12 DIAGNOSIS — U071 COVID-19 virus infection: Principal | ICD-10-CM

## 2020-03-13 DIAGNOSIS — M3219 Other organ or system involvement in systemic lupus erythematosus: Principal | ICD-10-CM

## 2020-03-13 MED ORDER — PREDNISONE 10 MG TABLET
ORAL_TABLET | Freq: Every day | ORAL | 1 refills | 90.00000 days | Status: CP
Start: 2020-03-13 — End: ?

## 2020-03-14 ENCOUNTER — Telehealth: Admit: 2020-03-14 | Discharge: 2020-03-15 | Payer: PRIVATE HEALTH INSURANCE

## 2020-03-17 MED ORDER — PREDNISONE 5 MG TABLET
Freq: Every day | ORAL | 0.00000 days
Start: 2020-03-17 — End: ?

## 2020-03-17 MED ORDER — PREDNISONE 1 MG TABLET
Freq: Every day | ORAL | 0 days
Start: 2020-03-17 — End: ?

## 2020-03-25 MED ORDER — FLUCONAZOLE 200 MG TABLET
ORAL_TABLET | Freq: Every day | ORAL | 0 refills | 10 days | Status: CP
Start: 2020-03-25 — End: 2020-04-04

## 2020-04-01 ENCOUNTER — Ambulatory Visit: Admit: 2020-04-01 | Discharge: 2020-04-02 | Payer: PRIVATE HEALTH INSURANCE

## 2020-04-02 ENCOUNTER — Ambulatory Visit
Admit: 2020-04-02 | Discharge: 2020-04-02 | Payer: PRIVATE HEALTH INSURANCE | Attending: Internal Medicine | Primary: Internal Medicine

## 2020-04-02 ENCOUNTER — Ambulatory Visit: Admit: 2020-04-02 | Discharge: 2020-04-02 | Payer: PRIVATE HEALTH INSURANCE

## 2020-04-02 DIAGNOSIS — M3219 Other organ or system involvement in systemic lupus erythematosus: Principal | ICD-10-CM

## 2020-04-02 DIAGNOSIS — R161 Splenomegaly, not elsewhere classified: Principal | ICD-10-CM

## 2020-04-03 ENCOUNTER — Ambulatory Visit: Admit: 2020-04-03 | Discharge: 2020-04-04 | Payer: PRIVATE HEALTH INSURANCE | Attending: Surgery | Primary: Surgery

## 2020-04-03 DIAGNOSIS — R109 Unspecified abdominal pain: Principal | ICD-10-CM

## 2020-04-04 DIAGNOSIS — G43409 Hemiplegic migraine, not intractable, without status migrainosus: Principal | ICD-10-CM

## 2020-04-04 DIAGNOSIS — G43109 Migraine with aura, not intractable, without status migrainosus: Principal | ICD-10-CM

## 2020-04-05 DIAGNOSIS — M3219 Other organ or system involvement in systemic lupus erythematosus: Principal | ICD-10-CM

## 2020-04-05 MED ORDER — MYCOPHENOLATE MOFETIL 500 MG TABLET: 500 mg | tablet | Freq: Two times a day (BID) | 1 refills | 30 days | Status: AC

## 2020-04-05 MED ORDER — VERAPAMIL 120 MG TABLET
ORAL_TABLET | 0 refills | 0 days | Status: CP
Start: 2020-04-05 — End: ?

## 2020-04-05 MED ORDER — MYCOPHENOLATE MOFETIL 500 MG TABLET
ORAL_TABLET | Freq: Two times a day (BID) | ORAL | 1 refills | 30.00000 days | Status: CP
Start: 2020-04-05 — End: 2020-04-05

## 2020-04-12 ENCOUNTER — Ambulatory Visit: Admit: 2020-04-12 | Discharge: 2020-04-13 | Payer: PRIVATE HEALTH INSURANCE

## 2020-04-16 ENCOUNTER — Ambulatory Visit: Admit: 2020-04-16 | Discharge: 2020-04-17 | Payer: PRIVATE HEALTH INSURANCE | Attending: Family | Primary: Family

## 2020-04-16 DIAGNOSIS — R202 Paresthesia of skin: Principal | ICD-10-CM

## 2020-04-16 DIAGNOSIS — G43409 Hemiplegic migraine, not intractable, without status migrainosus: Principal | ICD-10-CM

## 2020-04-16 DIAGNOSIS — G44219 Episodic tension-type headache, not intractable: Principal | ICD-10-CM

## 2020-04-16 DIAGNOSIS — R200 Anesthesia of skin: Secondary | ICD-10-CM

## 2020-04-16 DIAGNOSIS — G43109 Migraine with aura, not intractable, without status migrainosus: Principal | ICD-10-CM

## 2020-04-16 MED ORDER — VERAPAMIL 120 MG TABLET
ORAL_TABLET | Freq: Two times a day (BID) | ORAL | 3 refills | 30 days | Status: CP
Start: 2020-04-16 — End: ?

## 2020-04-16 MED ORDER — RIMEGEPANT 75 MG DISINTEGRATING TABLET
ORAL_TABLET | ORAL | 3 refills | 32 days | Status: CP
Start: 2020-04-16 — End: ?

## 2020-04-18 ENCOUNTER — Ambulatory Visit: Admit: 2020-04-18 | Discharge: 2020-04-19 | Payer: PRIVATE HEALTH INSURANCE

## 2020-05-14 MED ORDER — ADVAIR HFA 115 MCG-21 MCG/ACTUATION AEROSOL INHALER
Freq: Two times a day (BID) | RESPIRATORY_TRACT | 11 refills | 30 days | Status: CP
Start: 2020-05-14 — End: 2021-05-14

## 2020-07-18 ENCOUNTER — Telehealth: Admit: 2020-07-18 | Discharge: 2020-07-19 | Payer: PRIVATE HEALTH INSURANCE | Attending: Family | Primary: Family

## 2020-07-18 DIAGNOSIS — G43409 Hemiplegic migraine, not intractable, without status migrainosus: Principal | ICD-10-CM

## 2020-07-18 DIAGNOSIS — G43109 Migraine with aura, not intractable, without status migrainosus: Principal | ICD-10-CM

## 2020-07-18 MED ORDER — RIMEGEPANT 75 MG DISINTEGRATING TABLET
ORAL_TABLET | ORAL | 11 refills | 32 days | Status: CP
Start: 2020-07-18 — End: ?

## 2020-07-30 ENCOUNTER — Ambulatory Visit: Admit: 2020-07-30 | Discharge: 2020-07-30 | Payer: PRIVATE HEALTH INSURANCE

## 2020-07-30 ENCOUNTER — Ambulatory Visit
Admit: 2020-07-30 | Discharge: 2020-07-30 | Payer: PRIVATE HEALTH INSURANCE | Attending: Internal Medicine | Primary: Internal Medicine

## 2020-07-30 DIAGNOSIS — M3219 Other organ or system involvement in systemic lupus erythematosus: Principal | ICD-10-CM

## 2020-07-30 MED ORDER — BELIMUMAB 200 MG/ML SUBCUTANEOUS AUTO-INJECTOR
11 refills | 0 days | Status: CP
Start: 2020-07-30 — End: ?

## 2020-07-30 MED ORDER — PREDNISONE 5 MG TABLET
ORAL_TABLET | ORAL | 6 refills | 0.00000 days | Status: CP
Start: 2020-07-30 — End: ?

## 2020-07-31 DIAGNOSIS — G43109 Migraine with aura, not intractable, without status migrainosus: Principal | ICD-10-CM

## 2020-07-31 DIAGNOSIS — G43409 Hemiplegic migraine, not intractable, without status migrainosus: Principal | ICD-10-CM

## 2020-08-01 MED ORDER — VERAPAMIL 120 MG TABLET
ORAL_TABLET | 5 refills | 0 days | Status: CP
Start: 2020-08-01 — End: ?

## 2020-08-06 DIAGNOSIS — M3219 Other organ or system involvement in systemic lupus erythematosus: Principal | ICD-10-CM

## 2020-08-06 DIAGNOSIS — M25562 Pain in left knee: Principal | ICD-10-CM

## 2020-08-08 DIAGNOSIS — T148XXA Other injury of unspecified body region, initial encounter: Principal | ICD-10-CM

## 2020-08-17 ENCOUNTER — Ambulatory Visit: Admit: 2020-08-17 | Discharge: 2020-08-18 | Payer: PRIVATE HEALTH INSURANCE

## 2020-08-28 DIAGNOSIS — M3219 Other organ or system involvement in systemic lupus erythematosus: Principal | ICD-10-CM

## 2020-08-28 MED ORDER — MYCOPHENOLATE MOFETIL 500 MG TABLET
ORAL_TABLET | Freq: Two times a day (BID) | ORAL | 0 refills | 90.00000 days | Status: CP
Start: 2020-08-28 — End: 2020-11-26

## 2020-08-30 ENCOUNTER — Ambulatory Visit: Admit: 2020-08-30 | Discharge: 2020-08-31

## 2020-08-30 DIAGNOSIS — M3219 Other organ or system involvement in systemic lupus erythematosus: Principal | ICD-10-CM

## 2020-08-30 MED ORDER — MYCOPHENOLATE MOFETIL 500 MG TABLET
ORAL_TABLET | Freq: Two times a day (BID) | ORAL | 0 refills | 90.00000 days | Status: CP
Start: 2020-08-30 — End: 2020-11-28

## 2020-09-02 NOTE — Unmapped (Addendum)
Addendum 09/04/2211:31:  Patient is not eligible for co pay card - clinic is seeking manufacturer assistance for this medication instead.  At this time they will not be filling at the [facility].    Karene Fry Sinodis, PharmD, BCPS  Clinical Pharmacist - [facility]  [address], [place], [place] [address]  Phone: [phone] - Fax. [phone]      [facility] Specialty Medication Onboarding    Specialty Medication: Cellcept 500mg  tablet  Prior Authorization: Not Required   Financial Assistance: Yes - copay card approved as secondary   Final Copay/Day Supply: $15 / 30 days    Insurance Restrictions: Yes - max 1 month supply     Notes to Pharmacist: N/A    The triage team has completed the benefits investigation and has determined that the patient is able to fill this medication at [facility]. Please contact the patient to complete the onboarding or follow up with the prescribing physician as needed.

## 2020-09-10 ENCOUNTER — Ambulatory Visit: Admit: 2020-09-10 | Discharge: 2020-09-11 | Payer: PRIVATE HEALTH INSURANCE

## 2020-09-10 DIAGNOSIS — H838X3 Other specified diseases of inner ear, bilateral: Principal | ICD-10-CM

## 2020-09-10 DIAGNOSIS — G43809 Other migraine, not intractable, without status migrainosus: Principal | ICD-10-CM

## 2020-09-17 ENCOUNTER — Ambulatory Visit: Admit: 2020-09-17 | Discharge: 2020-09-18 | Payer: PRIVATE HEALTH INSURANCE | Attending: Family | Primary: Family

## 2020-09-17 DIAGNOSIS — M3219 Other organ or system involvement in systemic lupus erythematosus: Principal | ICD-10-CM

## 2020-09-17 MED ORDER — MYCOPHENOLATE MOFETIL 500 MG TABLET
ORAL_TABLET | Freq: Two times a day (BID) | ORAL | 0 refills | 90.00000 days | Status: CP
Start: 2020-09-17 — End: 2020-12-16

## 2020-09-22 ENCOUNTER — Ambulatory Visit
Admit: 2020-09-22 | Discharge: 2020-09-23 | Disposition: A | Discharge status: Home / Self Care | Payer: PRIVATE HEALTH INSURANCE | Attending: Emergency Medicine

## 2020-09-22 DIAGNOSIS — S8011XA Contusion of right lower leg, initial encounter: Principal | ICD-10-CM

## 2020-09-25 ENCOUNTER — Ambulatory Visit: Admit: 2020-09-25 | Discharge: 2020-09-25 | Payer: PRIVATE HEALTH INSURANCE | Attending: Family | Primary: Family

## 2020-09-25 ENCOUNTER — Ambulatory Visit: Admit: 2020-09-25 | Discharge: 2020-09-25 | Payer: PRIVATE HEALTH INSURANCE

## 2020-09-25 DIAGNOSIS — M3219 Other organ or system involvement in systemic lupus erythematosus: Principal | ICD-10-CM

## 2020-10-07 DIAGNOSIS — M3219 Other organ or system involvement in systemic lupus erythematosus: Principal | ICD-10-CM

## 2020-10-09 MED ORDER — HYDROXYCHLOROQUINE 200 MG TABLET
ORAL_TABLET | 6 refills | 0 days | Status: CP
Start: 2020-10-09 — End: ?

## 2020-10-10 ENCOUNTER — Ambulatory Visit: Admit: 2020-10-10 | Discharge: 2020-10-11 | Payer: PRIVATE HEALTH INSURANCE

## 2020-10-11 ENCOUNTER — Ambulatory Visit: Admit: 2020-10-11 | Discharge: 2020-10-12 | Payer: PRIVATE HEALTH INSURANCE

## 2020-11-10 DIAGNOSIS — G43109 Migraine with aura, not intractable, without status migrainosus: Principal | ICD-10-CM

## 2020-11-10 DIAGNOSIS — G43409 Hemiplegic migraine, not intractable, without status migrainosus: Principal | ICD-10-CM

## 2020-11-18 MED ORDER — VERAPAMIL 120 MG TABLET
ORAL_TABLET | 1 refills | 0 days | Status: CP
Start: 2020-11-18 — End: ?

## 2020-11-21 MED ORDER — NYSTATIN 100,000 UNIT/GRAM TOPICAL OINTMENT
0 days
Start: 2020-11-21 — End: ?

## 2020-12-09 MED ORDER — VENLAFAXINE ER 150 MG CAPSULE,EXTENDED RELEASE 24 HR
Freq: Every morning | ORAL | 0 days
Start: 2020-12-09 — End: ?

## 2020-12-17 ENCOUNTER — Ambulatory Visit: Admit: 2020-12-17 | Discharge: 2020-12-18 | Payer: PRIVATE HEALTH INSURANCE | Attending: Family | Primary: Family

## 2020-12-19 ENCOUNTER — Ambulatory Visit: Admit: 2020-12-19 | Discharge: 2020-12-20 | Payer: PRIVATE HEALTH INSURANCE

## 2020-12-31 ENCOUNTER — Ambulatory Visit: Admit: 2020-12-31 | Discharge: 2021-01-01 | Payer: PRIVATE HEALTH INSURANCE | Attending: Family | Primary: Family

## 2020-12-31 DIAGNOSIS — G43109 Migraine with aura, not intractable, without status migrainosus: Principal | ICD-10-CM

## 2020-12-31 DIAGNOSIS — G43409 Hemiplegic migraine, not intractable, without status migrainosus: Principal | ICD-10-CM

## 2020-12-31 MED ORDER — VERAPAMIL 120 MG TABLET
ORAL_TABLET | Freq: Two times a day (BID) | ORAL | 3 refills | 30 days | Status: CP
Start: 2020-12-31 — End: ?

## 2020-12-31 MED ORDER — RIMEGEPANT 75 MG DISINTEGRATING TABLET
ORAL_TABLET | ORAL | 11 refills | 32 days | Status: CP
Start: 2020-12-31 — End: ?

## 2021-01-01 ENCOUNTER — Ambulatory Visit: Admit: 2021-01-01 | Discharge: 2021-01-02 | Payer: PRIVATE HEALTH INSURANCE

## 2021-01-01 DIAGNOSIS — Z01818 Encounter for other preprocedural examination: Principal | ICD-10-CM

## 2021-01-01 DIAGNOSIS — E890 Postprocedural hypothyroidism: Principal | ICD-10-CM

## 2021-01-01 DIAGNOSIS — F419 Anxiety disorder, unspecified: Principal | ICD-10-CM

## 2021-01-01 DIAGNOSIS — M3219 Other organ or system involvement in systemic lupus erythematosus: Principal | ICD-10-CM

## 2021-01-01 DIAGNOSIS — J4520 Mild intermittent asthma, uncomplicated: Principal | ICD-10-CM

## 2021-01-01 DIAGNOSIS — F32A Anxiety and depression: Principal | ICD-10-CM

## 2021-01-06 ENCOUNTER — Encounter
Admit: 2021-01-06 | Discharge: 2021-01-07 | Payer: PRIVATE HEALTH INSURANCE | Attending: Adult Health | Primary: Adult Health

## 2021-01-06 ENCOUNTER — Ambulatory Visit: Admit: 2021-01-06 | Discharge: 2021-01-07 | Payer: PRIVATE HEALTH INSURANCE

## 2021-01-06 ENCOUNTER — Encounter
Admit: 2021-01-06 | Discharge: 2021-01-07 | Payer: PRIVATE HEALTH INSURANCE | Attending: Specialist | Primary: Specialist

## 2021-01-06 MED ORDER — DOCUSATE SODIUM 100 MG CAPSULE
ORAL_CAPSULE | Freq: Two times a day (BID) | ORAL | 0 refills | 10.00000 days | Status: CP
Start: 2021-01-06 — End: 2021-01-16
  Filled 2021-01-07: qty 20, 10d supply, fill #0

## 2021-01-06 MED ORDER — OXYCODONE 5 MG TABLET
ORAL_TABLET | ORAL | 0 refills | 3.00000 days | Status: CP | PRN
Start: 2021-01-06 — End: 2021-01-11
  Filled 2021-01-07: qty 30, 3d supply, fill #0

## 2021-01-06 MED ORDER — ASPIRIN 81 MG TABLET,DELAYED RELEASE
ORAL_TABLET | Freq: Every day | ORAL | 0 refills | 30.00000 days | Status: CP
Start: 2021-01-06 — End: 2021-02-05
  Filled 2021-01-07: qty 30, 30d supply, fill #0

## 2021-01-06 MED ORDER — GABAPENTIN 100 MG CAPSULE
ORAL_CAPSULE | Freq: Three times a day (TID) | ORAL | 0 refills | 5.00000 days | Status: CP
Start: 2021-01-06 — End: 2021-01-11
  Filled 2021-01-07: qty 15, 5d supply, fill #0

## 2021-01-06 MED ORDER — ONDANSETRON HCL 4 MG TABLET
ORAL_TABLET | Freq: Three times a day (TID) | ORAL | 0 refills | 4.00000 days | Status: CP | PRN
Start: 2021-01-06 — End: 2021-02-05
  Filled 2021-01-07: qty 10, 4d supply, fill #0

## 2021-01-06 MED ORDER — ACETAMINOPHEN 500 MG TABLET
ORAL_TABLET | Freq: Three times a day (TID) | ORAL | 0 refills | 14.00000 days | Status: CP
Start: 2021-01-06 — End: 2021-01-20
  Filled 2021-01-07: qty 84, 14d supply, fill #0

## 2021-01-07 ENCOUNTER — Ambulatory Visit: Admit: 2021-01-07 | Discharge: 2021-01-08 | Payer: PRIVATE HEALTH INSURANCE

## 2021-01-16 ENCOUNTER — Ambulatory Visit: Admit: 2021-01-16 | Discharge: 2021-01-17 | Payer: PRIVATE HEALTH INSURANCE

## 2021-01-28 DIAGNOSIS — E890 Postprocedural hypothyroidism: Principal | ICD-10-CM

## 2021-01-28 MED ORDER — ARMOUR THYROID 90 MG TABLET
ORAL_TABLET | 3 refills | 0 days
Start: 2021-01-28 — End: ?

## 2021-01-29 MED ORDER — ARMOUR THYROID 90 MG TABLET
ORAL_TABLET | 3 refills | 0 days | Status: CP
Start: 2021-01-29 — End: ?

## 2021-02-11 ENCOUNTER — Ambulatory Visit
Admit: 2021-02-11 | Discharge: 2021-02-12 | Payer: PRIVATE HEALTH INSURANCE | Attending: Internal Medicine | Primary: Internal Medicine

## 2021-02-11 DIAGNOSIS — M2550 Pain in unspecified joint: Principal | ICD-10-CM

## 2021-02-11 DIAGNOSIS — M3219 Other organ or system involvement in systemic lupus erythematosus: Principal | ICD-10-CM

## 2021-02-11 DIAGNOSIS — M25562 Pain in left knee: Principal | ICD-10-CM

## 2021-02-11 DIAGNOSIS — M791 Myalgia, unspecified site: Principal | ICD-10-CM

## 2021-02-11 MED ORDER — CELECOXIB 200 MG CAPSULE
ORAL_CAPSULE | Freq: Two times a day (BID) | ORAL | 2 refills | 30 days | Status: CP
Start: 2021-02-11 — End: 2022-02-11

## 2021-02-11 MED ORDER — PREDNISONE 1 MG TABLET
ORAL_TABLET | ORAL | 3 refills | 0.00000 days | Status: CP
Start: 2021-02-11 — End: ?

## 2021-02-11 MED ORDER — CYCLOBENZAPRINE 5 MG TABLET
ORAL_TABLET | ORAL | 2 refills | 0.00000 days | Status: CP
Start: 2021-02-11 — End: ?

## 2021-02-11 MED ORDER — HYDROCODONE 5 MG-ACETAMINOPHEN 325 MG TABLET
ORAL_TABLET | ORAL | 0 refills | 0.00000 days | Status: CP
Start: 2021-02-11 — End: ?

## 2021-02-13 ENCOUNTER — Ambulatory Visit: Admit: 2021-02-13 | Discharge: 2021-02-14 | Payer: PRIVATE HEALTH INSURANCE

## 2021-02-21 DIAGNOSIS — E890 Postprocedural hypothyroidism: Principal | ICD-10-CM

## 2021-02-21 MED ORDER — ARMOUR THYROID 90 MG TABLET
ORAL_TABLET | 3 refills | 0.00000 days
Start: 2021-02-21 — End: ?

## 2021-02-25 MED ORDER — ARMOUR THYROID 90 MG TABLET
ORAL_TABLET | Freq: Two times a day (BID) | ORAL | 3 refills | 90.00000 days | Status: CP
Start: 2021-02-25 — End: 2021-03-27

## 2021-03-12 MED ORDER — FEROSUL 325 MG (65 MG IRON) TABLET
ORAL_TABLET | 5 refills | 0 days | Status: CP
Start: 2021-03-12 — End: ?

## 2021-03-18 ENCOUNTER — Ambulatory Visit: Admit: 2021-03-18 | Discharge: 2021-03-19 | Payer: PRIVATE HEALTH INSURANCE

## 2021-04-10 ENCOUNTER — Ambulatory Visit: Admit: 2021-04-10 | Discharge: 2021-04-11 | Payer: PRIVATE HEALTH INSURANCE | Attending: Family | Primary: Family

## 2021-04-10 DIAGNOSIS — Z7189 Other specified counseling: Principal | ICD-10-CM

## 2021-04-10 DIAGNOSIS — G43109 Migraine with aura, not intractable, without status migrainosus: Principal | ICD-10-CM

## 2021-04-10 DIAGNOSIS — G43409 Hemiplegic migraine, not intractable, without status migrainosus: Principal | ICD-10-CM

## 2021-04-10 MED ORDER — VERAPAMIL 120 MG TABLET
ORAL_TABLET | Freq: Two times a day (BID) | ORAL | 3 refills | 30 days | Status: CP
Start: 2021-04-10 — End: ?

## 2021-04-14 ENCOUNTER — Ambulatory Visit: Admit: 2021-04-14 | Discharge: 2021-04-15

## 2021-04-14 DIAGNOSIS — Z7189 Other specified counseling: Principal | ICD-10-CM

## 2021-04-17 DIAGNOSIS — M1712 Unilateral primary osteoarthritis, left knee: Principal | ICD-10-CM

## 2021-04-18 ENCOUNTER — Ambulatory Visit: Admit: 2021-04-18 | Discharge: 2021-04-19 | Payer: PRIVATE HEALTH INSURANCE | Attending: Family | Primary: Family

## 2021-04-18 DIAGNOSIS — M3219 Other organ or system involvement in systemic lupus erythematosus: Principal | ICD-10-CM

## 2021-04-18 MED ORDER — PREDNISONE 1 MG TABLET
ORAL_TABLET | 3 refills | 0 days | Status: CP
Start: 2021-04-18 — End: ?

## 2021-04-28 ENCOUNTER — Ambulatory Visit: Admit: 2021-04-28 | Discharge: 2021-04-29 | Payer: PRIVATE HEALTH INSURANCE

## 2021-04-29 ENCOUNTER — Ambulatory Visit: Admit: 2021-04-29 | Discharge: 2021-04-30 | Payer: PRIVATE HEALTH INSURANCE

## 2021-04-30 DIAGNOSIS — M9689 Other intraoperative and postprocedural complications and disorders of the musculoskeletal system: Principal | ICD-10-CM

## 2021-05-08 ENCOUNTER — Institutional Professional Consult (permissible substitution): Admit: 2021-05-08 | Discharge: 2021-05-09 | Payer: PRIVATE HEALTH INSURANCE

## 2021-05-08 DIAGNOSIS — M791 Myalgia, unspecified site: Principal | ICD-10-CM

## 2021-05-08 MED ORDER — CELECOXIB 200 MG CAPSULE
ORAL_CAPSULE | 0 refills | 0 days | Status: CP
Start: 2021-05-08 — End: ?

## 2021-05-09 DIAGNOSIS — M3219 Other organ or system involvement in systemic lupus erythematosus: Principal | ICD-10-CM

## 2021-05-11 DIAGNOSIS — M3219 Other organ or system involvement in systemic lupus erythematosus: Principal | ICD-10-CM

## 2021-05-19 ENCOUNTER — Ambulatory Visit: Admit: 2021-05-19 | Discharge: 2021-05-20 | Payer: PRIVATE HEALTH INSURANCE

## 2021-05-19 DIAGNOSIS — J020 Streptococcal pharyngitis: Principal | ICD-10-CM

## 2021-05-19 DIAGNOSIS — M791 Myalgia, unspecified site: Principal | ICD-10-CM

## 2021-05-19 MED ORDER — AMOXICILLIN 875 MG-POTASSIUM CLAVULANATE 125 MG TABLET
ORAL_TABLET | Freq: Two times a day (BID) | ORAL | 0 refills | 10.00000 days | Status: CP
Start: 2021-05-19 — End: 2021-05-29

## 2021-05-19 MED ORDER — FLUCONAZOLE 150 MG TABLET
ORAL_TABLET | Freq: Once | ORAL | 0 refills | 1 days | Status: CP
Start: 2021-05-19 — End: 2021-05-19

## 2021-05-19 MED ORDER — CYCLOBENZAPRINE 5 MG TABLET
ORAL_TABLET | 2 refills | 0.00000 days
Start: 2021-05-19 — End: ?

## 2021-05-19 MED ORDER — CELECOXIB 200 MG CAPSULE
ORAL_CAPSULE | 3 refills | 0.00000 days
Start: 2021-05-19 — End: ?

## 2021-05-20 MED ORDER — CELECOXIB 200 MG CAPSULE
ORAL_CAPSULE | 3 refills | 0.00000 days
Start: 2021-05-20 — End: ?

## 2021-05-20 MED ORDER — CYCLOBENZAPRINE 5 MG TABLET
ORAL_TABLET | ORAL | 2 refills | 0.00000 days | Status: CP
Start: 2021-05-20 — End: ?

## 2021-05-22 DIAGNOSIS — Z20822 Encounter for preoperative screening laboratory testing for COVID-19 virus: Principal | ICD-10-CM

## 2021-05-22 DIAGNOSIS — S8992XA Unspecified injury of left lower leg, initial encounter: Principal | ICD-10-CM

## 2021-05-22 DIAGNOSIS — Z01812 Encounter for preprocedural laboratory examination: Principal | ICD-10-CM

## 2021-05-27 ENCOUNTER — Telehealth: Admit: 2021-05-27 | Discharge: 2021-05-28 | Payer: PRIVATE HEALTH INSURANCE | Attending: Family | Primary: Family

## 2021-05-27 DIAGNOSIS — G43409 Hemiplegic migraine, not intractable, without status migrainosus: Principal | ICD-10-CM

## 2021-05-27 DIAGNOSIS — G43109 Migraine with aura, not intractable, without status migrainosus: Principal | ICD-10-CM

## 2021-05-27 MED ORDER — VERAPAMIL 120 MG TABLET
ORAL_TABLET | Freq: Two times a day (BID) | ORAL | 1 refills | 90 days | Status: CP
Start: 2021-05-27 — End: ?

## 2021-06-07 DIAGNOSIS — M3219 Other organ or system involvement in systemic lupus erythematosus: Principal | ICD-10-CM

## 2021-06-11 ENCOUNTER — Ambulatory Visit: Admit: 2021-06-11 | Discharge: 2021-06-12 | Payer: PRIVATE HEALTH INSURANCE

## 2021-06-11 ENCOUNTER — Ambulatory Visit
Admit: 2021-06-11 | Discharge: 2021-06-12 | Payer: PRIVATE HEALTH INSURANCE | Attending: Adolescent Medicine | Primary: Adolescent Medicine

## 2021-06-11 DIAGNOSIS — J069 Acute upper respiratory infection, unspecified: Principal | ICD-10-CM

## 2021-06-11 DIAGNOSIS — M3219 Other organ or system involvement in systemic lupus erythematosus: Principal | ICD-10-CM

## 2021-06-11 DIAGNOSIS — H52203 Unspecified astigmatism, bilateral: Principal | ICD-10-CM

## 2021-06-11 DIAGNOSIS — H5213 Myopia, bilateral: Principal | ICD-10-CM

## 2021-06-11 MED ORDER — CEFDINIR 300 MG CAPSULE
ORAL_CAPSULE | Freq: Two times a day (BID) | ORAL | 0 refills | 10 days | Status: CP
Start: 2021-06-11 — End: 2021-06-21

## 2021-06-11 MED ORDER — ALBUTEROL SULFATE HFA 90 MCG/ACTUATION AEROSOL INHALER
Freq: Four times a day (QID) | RESPIRATORY_TRACT | 0 refills | 0 days | Status: CP | PRN
Start: 2021-06-11 — End: ?

## 2021-06-11 MED ORDER — BENZONATATE 200 MG CAPSULE
ORAL_CAPSULE | Freq: Three times a day (TID) | ORAL | 0 refills | 10 days | Status: CP | PRN
Start: 2021-06-11 — End: 2021-06-18

## 2021-06-25 ENCOUNTER — Ambulatory Visit: Admit: 2021-06-25 | Discharge: 2021-06-26 | Payer: PRIVATE HEALTH INSURANCE

## 2021-06-25 DIAGNOSIS — Z20822 Encounter for preoperative screening laboratory testing for COVID-19 virus: Principal | ICD-10-CM

## 2021-06-25 DIAGNOSIS — Z01812 Encounter for preprocedural laboratory examination: Principal | ICD-10-CM

## 2021-06-26 ENCOUNTER — Ambulatory Visit: Admit: 2021-06-26 | Discharge: 2021-06-27 | Payer: PRIVATE HEALTH INSURANCE

## 2021-06-26 MED ORDER — DOCUSATE SODIUM 100 MG CAPSULE
ORAL_CAPSULE | Freq: Two times a day (BID) | ORAL | 0 refills | 10 days
Start: 2021-06-26 — End: 2021-07-06

## 2021-06-26 MED ORDER — GABAPENTIN 100 MG CAPSULE
ORAL_CAPSULE | Freq: Three times a day (TID) | ORAL | 0 refills | 10 days
Start: 2021-06-26 — End: 2021-07-06

## 2021-06-26 MED ORDER — ONDANSETRON 4 MG DISINTEGRATING TABLET
ORAL_TABLET | Freq: Three times a day (TID) | ORAL | 0 refills | 5 days | PRN
Start: 2021-06-26 — End: 2021-07-01

## 2021-06-26 MED ORDER — ACETAMINOPHEN 500 MG CAPSULE
ORAL_CAPSULE | Freq: Three times a day (TID) | ORAL | 1 refills | 0 days
Start: 2021-06-26 — End: 2021-07-06

## 2021-06-26 MED ORDER — ASPIRIN 81 MG TABLET,DELAYED RELEASE
ORAL_TABLET | Freq: Every day | ORAL | 0 refills | 30 days
Start: 2021-06-26 — End: 2021-07-26

## 2021-06-26 MED ORDER — OXYCODONE 5 MG TABLET
ORAL_TABLET | ORAL | 0 refills | 5 days | PRN
Start: 2021-06-26 — End: 2021-07-01

## 2021-06-27 ENCOUNTER — Ambulatory Visit
Admit: 2021-06-27 | Discharge: 2021-06-28 | Disposition: A | Discharge status: Home Health | Payer: PRIVATE HEALTH INSURANCE

## 2021-06-27 ENCOUNTER — Encounter
Admit: 2021-06-27 | Discharge: 2021-06-28 | Disposition: A | Discharge status: Home Health | Payer: PRIVATE HEALTH INSURANCE | Attending: Anesthesiology

## 2021-06-27 ENCOUNTER — Encounter
Admit: 2021-06-27 | Discharge: 2021-06-28 | Disposition: A | Discharge status: Home / Self Care | Payer: PRIVATE HEALTH INSURANCE | Attending: Anesthesiology

## 2021-06-28 MED ORDER — ONDANSETRON 4 MG DISINTEGRATING TABLET
ORAL_TABLET | Freq: Three times a day (TID) | ORAL | 0 refills | 5 days | Status: CP | PRN
Start: 2021-06-28 — End: 2021-07-03
  Filled 2021-06-28: qty 15, 5d supply, fill #0

## 2021-06-28 MED ORDER — ASPIRIN 81 MG TABLET,DELAYED RELEASE
ORAL_TABLET | Freq: Every day | ORAL | 0 refills | 30 days | Status: CP
Start: 2021-06-28 — End: 2021-07-28
  Filled 2021-06-28: qty 60, 10d supply, fill #0
  Filled 2021-06-28: qty 30, 30d supply, fill #0

## 2021-06-28 MED ORDER — DOCUSATE SODIUM 100 MG CAPSULE
ORAL_CAPSULE | Freq: Two times a day (BID) | ORAL | 0 refills | 10 days | Status: CP
Start: 2021-06-28 — End: 2021-07-08
  Filled 2021-06-28: qty 20, 10d supply, fill #0

## 2021-06-28 MED ORDER — ACETAMINOPHEN 500 MG TABLET
ORAL_TABLET | Freq: Three times a day (TID) | ORAL | 1 refills | 10 days | Status: CP
Start: 2021-06-28 — End: 2021-07-08

## 2021-06-28 MED ORDER — OXYCODONE 5 MG TABLET
ORAL_TABLET | ORAL | 0 refills | 5 days | Status: CP | PRN
Start: 2021-06-28 — End: 2021-07-03
  Filled 2021-06-28: qty 30, 5d supply, fill #0

## 2021-06-28 MED ORDER — GABAPENTIN 100 MG CAPSULE
ORAL_CAPSULE | Freq: Three times a day (TID) | ORAL | 0 refills | 10 days | Status: CP
Start: 2021-06-28 — End: 2021-07-08
  Filled 2021-06-28: qty 30, 10d supply, fill #0

## 2021-06-30 ENCOUNTER — Ambulatory Visit: Admit: 2021-06-30 | Discharge: 2021-07-01 | Payer: PRIVATE HEALTH INSURANCE

## 2021-07-01 DIAGNOSIS — J069 Acute upper respiratory infection, unspecified: Principal | ICD-10-CM

## 2021-07-01 MED ORDER — ALBUTEROL SULFATE HFA 90 MCG/ACTUATION AEROSOL INHALER
0 refills | 0 days
Start: 2021-07-01 — End: ?

## 2021-07-02 DIAGNOSIS — J069 Acute upper respiratory infection, unspecified: Principal | ICD-10-CM

## 2021-07-02 MED ORDER — ALBUTEROL SULFATE HFA 90 MCG/ACTUATION AEROSOL INHALER
Freq: Four times a day (QID) | RESPIRATORY_TRACT | 6 refills | 0 days | Status: CP | PRN
Start: 2021-07-02 — End: 2021-08-01

## 2021-07-03 ENCOUNTER — Ambulatory Visit: Admit: 2021-07-03 | Discharge: 2021-07-04 | Payer: PRIVATE HEALTH INSURANCE

## 2021-07-10 DIAGNOSIS — S8992XA Unspecified injury of left lower leg, initial encounter: Principal | ICD-10-CM

## 2021-07-10 DIAGNOSIS — Z20822 Encounter for preoperative screening laboratory testing for COVID-19 virus: Principal | ICD-10-CM

## 2021-07-10 DIAGNOSIS — Z01812 Encounter for preprocedural laboratory examination: Principal | ICD-10-CM

## 2021-07-10 MED ORDER — ASPIRIN 81 MG TABLET,DELAYED RELEASE
ORAL_TABLET | Freq: Every day | ORAL | 0 refills | 30 days
Start: 2021-07-10 — End: 2021-08-09

## 2021-07-10 MED ORDER — OXYCODONE 5 MG TABLET
ORAL_TABLET | ORAL | 0 refills | 5 days | PRN
Start: 2021-07-10 — End: 2021-07-15

## 2021-07-10 MED ORDER — ONDANSETRON 4 MG DISINTEGRATING TABLET
ORAL_TABLET | Freq: Three times a day (TID) | ORAL | 0 refills | 5 days | PRN
Start: 2021-07-10 — End: 2021-07-15

## 2021-07-10 MED ORDER — ACETAMINOPHEN 500 MG CAPSULE
ORAL_CAPSULE | Freq: Three times a day (TID) | ORAL | 1 refills | 0 days
Start: 2021-07-10 — End: 2021-07-20

## 2021-07-10 MED ORDER — DOCUSATE SODIUM 100 MG CAPSULE
ORAL_CAPSULE | Freq: Two times a day (BID) | ORAL | 0 refills | 10 days
Start: 2021-07-10 — End: 2021-07-20

## 2021-07-10 MED ORDER — GABAPENTIN 100 MG CAPSULE
ORAL_CAPSULE | Freq: Three times a day (TID) | ORAL | 0 refills | 10 days
Start: 2021-07-10 — End: 2021-07-20

## 2021-07-11 ENCOUNTER — Encounter
Admit: 2021-07-11 | Discharge: 2021-07-12 | Payer: PRIVATE HEALTH INSURANCE | Attending: Anesthesiology | Primary: Anesthesiology

## 2021-07-11 ENCOUNTER — Ambulatory Visit: Admit: 2021-07-11 | Discharge: 2021-07-12 | Payer: PRIVATE HEALTH INSURANCE

## 2021-07-11 MED ORDER — GABAPENTIN 100 MG CAPSULE
ORAL_CAPSULE | Freq: Three times a day (TID) | ORAL | 0 refills | 10.00000 days | Status: CP
Start: 2021-07-11 — End: 2021-07-21

## 2021-07-11 MED ORDER — OXYCODONE 5 MG TABLET
ORAL_TABLET | ORAL | 0 refills | 5.00000 days | Status: CP | PRN
Start: 2021-07-11 — End: 2021-07-11

## 2021-07-11 MED ORDER — DOCUSATE SODIUM 100 MG CAPSULE
ORAL_CAPSULE | Freq: Two times a day (BID) | ORAL | 0 refills | 10 days | Status: CP
Start: 2021-07-11 — End: 2021-07-21

## 2021-07-11 MED ORDER — ONDANSETRON 4 MG DISINTEGRATING TABLET
ORAL_TABLET | Freq: Three times a day (TID) | ORAL | 0 refills | 5.00000 days | Status: CP | PRN
Start: 2021-07-11 — End: 2021-07-16

## 2021-07-11 MED ORDER — SULFAMETHOXAZOLE 800 MG-TRIMETHOPRIM 160 MG TABLET
ORAL_TABLET | Freq: Two times a day (BID) | ORAL | 0 refills | 14.00000 days | Status: CP
Start: 2021-07-11 — End: 2021-07-25

## 2021-07-11 MED ORDER — TRANEXAMIC ACID 650 MG TABLET
ORAL_TABLET | Freq: Three times a day (TID) | ORAL | 0 refills | 10.00000 days | Status: CP
Start: 2021-07-11 — End: 2021-07-21

## 2021-07-11 MED ORDER — ASPIRIN 81 MG TABLET,DELAYED RELEASE
ORAL_TABLET | Freq: Every day | ORAL | 0 refills | 30.00000 days | Status: CP
Start: 2021-07-11 — End: 2021-07-11

## 2021-07-11 MED ORDER — ACETAMINOPHEN 500 MG CAPSULE
ORAL_CAPSULE | Freq: Three times a day (TID) | ORAL | 1 refills | 0.00000 days | Status: CP
Start: 2021-07-11 — End: 2021-07-21

## 2021-07-14 MED ORDER — OXYCODONE 5 MG TABLET
ORAL_TABLET | ORAL | 0 refills | 5 days | Status: CP | PRN
Start: 2021-07-14 — End: 2021-07-19

## 2021-07-14 MED ORDER — GABAPENTIN 100 MG CAPSULE
ORAL_CAPSULE | Freq: Three times a day (TID) | ORAL | 0 refills | 10 days | Status: CP
Start: 2021-07-14 — End: 2021-07-24

## 2021-07-17 ENCOUNTER — Ambulatory Visit: Admit: 2021-07-17 | Discharge: 2021-07-18 | Payer: PRIVATE HEALTH INSURANCE

## 2021-07-24 ENCOUNTER — Telehealth
Admit: 2021-07-24 | Discharge: 2021-07-25 | Payer: PRIVATE HEALTH INSURANCE | Attending: Student Health | Primary: Student Health

## 2021-07-24 DIAGNOSIS — Z9889 Other specified postprocedural states: Principal | ICD-10-CM

## 2021-07-24 DIAGNOSIS — T8130XA Disruption of wound, unspecified, initial encounter: Principal | ICD-10-CM

## 2021-07-24 MED ORDER — MINOCYCLINE 100 MG CAPSULE
ORAL_CAPSULE | Freq: Two times a day (BID) | ORAL | 2 refills | 30 days | Status: CP
Start: 2021-07-24 — End: 2021-10-22

## 2021-07-25 DIAGNOSIS — M9689 Other intraoperative and postprocedural complications and disorders of the musculoskeletal system: Principal | ICD-10-CM

## 2021-07-31 ENCOUNTER — Ambulatory Visit: Admit: 2021-07-31 | Discharge: 2021-07-31 | Payer: PRIVATE HEALTH INSURANCE

## 2021-08-05 DIAGNOSIS — M3219 Other organ or system involvement in systemic lupus erythematosus: Principal | ICD-10-CM

## 2021-08-08 DIAGNOSIS — Z792 Long term (current) use of antibiotics: Principal | ICD-10-CM

## 2021-08-08 DIAGNOSIS — T8130XA Disruption of wound, unspecified, initial encounter: Principal | ICD-10-CM

## 2021-08-08 MED ORDER — FLUCONAZOLE 150 MG TABLET
ORAL_TABLET | Freq: Once | ORAL | 0 refills | 1.00000 days | Status: CP
Start: 2021-08-08 — End: 2021-08-08

## 2021-08-08 MED ORDER — SULFAMETHOXAZOLE 800 MG-TRIMETHOPRIM 160 MG TABLET
ORAL_TABLET | Freq: Three times a day (TID) | ORAL | 1 refills | 30.00000 days | Status: CP
Start: 2021-08-08 — End: 2021-10-07

## 2021-08-12 ENCOUNTER — Ambulatory Visit: Admit: 2021-08-12 | Discharge: 2021-08-13 | Payer: PRIVATE HEALTH INSURANCE

## 2021-08-14 ENCOUNTER — Ambulatory Visit: Admit: 2021-08-14 | Discharge: 2021-08-15 | Payer: PRIVATE HEALTH INSURANCE

## 2021-08-14 DIAGNOSIS — Z79899 Other long term (current) drug therapy: Principal | ICD-10-CM

## 2021-08-15 DIAGNOSIS — M791 Myalgia, unspecified site: Principal | ICD-10-CM

## 2021-08-15 DIAGNOSIS — M3219 Other organ or system involvement in systemic lupus erythematosus: Principal | ICD-10-CM

## 2021-08-15 MED ORDER — CYCLOBENZAPRINE 5 MG TABLET
ORAL_TABLET | ORAL | 2 refills | 0.00000 days | Status: CP
Start: 2021-08-15 — End: ?

## 2021-08-15 MED ORDER — PREDNISONE 5 MG TABLET
ORAL_TABLET | 6 refills | 0 days | Status: CP
Start: 2021-08-15 — End: ?

## 2021-08-19 ENCOUNTER — Ambulatory Visit: Admit: 2021-08-19 | Discharge: 2021-08-20 | Payer: PRIVATE HEALTH INSURANCE

## 2021-08-21 ENCOUNTER — Ambulatory Visit: Admit: 2021-08-21 | Discharge: 2021-08-22 | Payer: PRIVATE HEALTH INSURANCE

## 2021-08-21 ENCOUNTER — Ambulatory Visit
Admit: 2021-08-21 | Discharge: 2021-08-22 | Payer: PRIVATE HEALTH INSURANCE | Attending: Student Health | Primary: Student Health

## 2021-08-21 DIAGNOSIS — T8130XA Disruption of wound, unspecified, initial encounter: Principal | ICD-10-CM

## 2021-08-21 DIAGNOSIS — Z9889 Other specified postprocedural states: Principal | ICD-10-CM

## 2021-08-21 DIAGNOSIS — Z792 Long term (current) use of antibiotics: Principal | ICD-10-CM

## 2021-08-27 DIAGNOSIS — G43109 Migraine with aura, not intractable, without status migrainosus: Principal | ICD-10-CM

## 2021-08-27 DIAGNOSIS — G43409 Hemiplegic migraine, not intractable, without status migrainosus: Principal | ICD-10-CM

## 2021-08-27 MED ORDER — VERAPAMIL 120 MG TABLET
ORAL_TABLET | 0 refills | 0 days | Status: CP
Start: 2021-08-27 — End: ?

## 2021-08-31 DIAGNOSIS — M3219 Other organ or system involvement in systemic lupus erythematosus: Principal | ICD-10-CM

## 2021-09-08 ENCOUNTER — Ambulatory Visit: Admit: 2021-09-08 | Discharge: 2021-09-09 | Payer: PRIVATE HEALTH INSURANCE

## 2021-09-28 DIAGNOSIS — M3219 Other organ or system involvement in systemic lupus erythematosus: Principal | ICD-10-CM

## 2021-09-30 ENCOUNTER — Ambulatory Visit
Admit: 2021-09-30 | Discharge: 2021-10-01 | Payer: PRIVATE HEALTH INSURANCE | Attending: Internal Medicine | Primary: Internal Medicine

## 2021-09-30 DIAGNOSIS — M3219 Other organ or system involvement in systemic lupus erythematosus: Principal | ICD-10-CM

## 2021-09-30 DIAGNOSIS — R1012 Left upper quadrant pain: Principal | ICD-10-CM

## 2021-09-30 DIAGNOSIS — R52 Pain, unspecified: Principal | ICD-10-CM

## 2021-09-30 DIAGNOSIS — L853 Xerosis cutis: Principal | ICD-10-CM

## 2021-09-30 MED ORDER — MELOXICAM 15 MG TABLET
ORAL_TABLET | Freq: Every day | ORAL | 3 refills | 30 days | Status: CP
Start: 2021-09-30 — End: 2022-09-30

## 2021-09-30 MED ORDER — TRIAMCINOLONE ACETONIDE 0.1 % TOPICAL CREAM
Freq: Two times a day (BID) | TOPICAL | 1 refills | 0 days | Status: CP
Start: 2021-09-30 — End: 2022-09-30

## 2021-09-30 MED ORDER — CLOBETASOL 0.05 % TOPICAL OINTMENT
Freq: Two times a day (BID) | TOPICAL | 0 refills | 0 days | Status: CP
Start: 2021-09-30 — End: 2022-09-30

## 2021-10-09 ENCOUNTER — Ambulatory Visit: Admit: 2021-10-09 | Discharge: 2021-10-10 | Payer: PRIVATE HEALTH INSURANCE

## 2021-10-16 ENCOUNTER — Ambulatory Visit
Admit: 2021-10-16 | Discharge: 2021-10-17 | Payer: PRIVATE HEALTH INSURANCE | Attending: Student Health | Primary: Student Health

## 2021-10-16 DIAGNOSIS — T8130XA Disruption of wound, unspecified, initial encounter: Principal | ICD-10-CM

## 2021-10-16 DIAGNOSIS — Z792 Long term (current) use of antibiotics: Principal | ICD-10-CM

## 2021-10-16 DIAGNOSIS — B379 Candidiasis, unspecified: Principal | ICD-10-CM

## 2021-10-16 MED ORDER — FLUCONAZOLE 150 MG TABLET
ORAL_TABLET | Freq: Once | ORAL | 0 refills | 3 days | Status: CP
Start: 2021-10-16 — End: 2021-10-16

## 2021-10-16 MED ORDER — SULFAMETHOXAZOLE 800 MG-TRIMETHOPRIM 160 MG TABLET
ORAL_TABLET | Freq: Two times a day (BID) | ORAL | 2 refills | 30 days | Status: CP
Start: 2021-10-16 — End: 2022-01-14

## 2021-10-26 DIAGNOSIS — M3219 Other organ or system involvement in systemic lupus erythematosus: Principal | ICD-10-CM

## 2021-11-04 DIAGNOSIS — M3219 Other organ or system involvement in systemic lupus erythematosus: Principal | ICD-10-CM

## 2021-11-04 DIAGNOSIS — L853 Xerosis cutis: Principal | ICD-10-CM

## 2021-11-04 MED ORDER — CLOBETASOL 0.05 % TOPICAL OINTMENT
Freq: Two times a day (BID) | TOPICAL | 0 refills | 0 days | Status: CP
Start: 2021-11-04 — End: 2022-11-04

## 2021-11-04 MED ORDER — HYDROXYCHLOROQUINE 200 MG TABLET
ORAL_TABLET | Freq: Two times a day (BID) | ORAL | 6 refills | 90 days | Status: CP
Start: 2021-11-04 — End: ?

## 2021-11-11 MED ORDER — ARMOUR THYROID 90 MG TABLET
ORAL_TABLET | Freq: Every day | ORAL | 0 refills | 90 days | Status: CP
Start: 2021-11-11 — End: 2022-02-09

## 2021-11-14 DIAGNOSIS — E890 Postprocedural hypothyroidism: Principal | ICD-10-CM

## 2021-11-14 MED ORDER — NP THYROID 90 MG TABLET
ORAL_TABLET | Freq: Two times a day (BID) | ORAL | 3 refills | 90 days | Status: CP
Start: 2021-11-14 — End: 2022-11-14

## 2021-11-18 ENCOUNTER — Ambulatory Visit: Admit: 2021-11-18 | Discharge: 2021-11-18 | Payer: PRIVATE HEALTH INSURANCE

## 2021-11-23 DIAGNOSIS — M3219 Other organ or system involvement in systemic lupus erythematosus: Principal | ICD-10-CM

## 2021-12-01 ENCOUNTER — Ambulatory Visit: Admit: 2021-12-01 | Discharge: 2021-12-02 | Payer: PRIVATE HEALTH INSURANCE

## 2021-12-02 ENCOUNTER — Emergency Department
Admit: 2021-12-02 | Discharge: 2021-12-02 | Disposition: A | Discharge status: Home / Self Care | Payer: PRIVATE HEALTH INSURANCE | Attending: Student in an Organized Health Care Education/Training Program

## 2021-12-02 ENCOUNTER — Ambulatory Visit
Admit: 2021-12-02 | Discharge: 2021-12-02 | Disposition: A | Discharge status: Home / Self Care | Payer: PRIVATE HEALTH INSURANCE | Attending: Student in an Organized Health Care Education/Training Program

## 2021-12-02 DIAGNOSIS — S81812A Laceration without foreign body, left lower leg, initial encounter: Principal | ICD-10-CM

## 2021-12-02 MED ORDER — CEPHALEXIN 500 MG CAPSULE
ORAL_CAPSULE | Freq: Four times a day (QID) | ORAL | 0 refills | 10 days | Status: CP
Start: 2021-12-02 — End: 2021-12-12

## 2021-12-04 ENCOUNTER — Telehealth
Admit: 2021-12-04 | Discharge: 2021-12-05 | Payer: PRIVATE HEALTH INSURANCE | Attending: Student Health | Primary: Student Health

## 2021-12-04 DIAGNOSIS — T8130XA Disruption of wound, unspecified, initial encounter: Principal | ICD-10-CM

## 2021-12-04 DIAGNOSIS — Z792 Long term (current) use of antibiotics: Principal | ICD-10-CM

## 2021-12-04 MED ORDER — SULFAMETHOXAZOLE 800 MG-TRIMETHOPRIM 160 MG TABLET
ORAL_TABLET | Freq: Two times a day (BID) | ORAL | 0 refills | 30 days | Status: CP
Start: 2021-12-04 — End: 2022-03-04

## 2021-12-12 ENCOUNTER — Ambulatory Visit
Admit: 2021-12-12 | Discharge: 2021-12-13 | Payer: PRIVATE HEALTH INSURANCE | Attending: Family Medicine | Primary: Family Medicine

## 2021-12-12 DIAGNOSIS — G43409 Hemiplegic migraine, not intractable, without status migrainosus: Principal | ICD-10-CM

## 2021-12-12 DIAGNOSIS — S81812D Laceration without foreign body, left lower leg, subsequent encounter: Principal | ICD-10-CM

## 2021-12-12 DIAGNOSIS — F32A Anxiety and depression: Principal | ICD-10-CM

## 2021-12-12 DIAGNOSIS — F419 Anxiety disorder, unspecified: Principal | ICD-10-CM

## 2021-12-20 DIAGNOSIS — M3219 Other organ or system involvement in systemic lupus erythematosus: Principal | ICD-10-CM

## 2022-01-01 ENCOUNTER — Ambulatory Visit: Admit: 2022-01-01 | Discharge: 2022-01-02 | Payer: PRIVATE HEALTH INSURANCE | Attending: Family | Primary: Family

## 2022-01-01 DIAGNOSIS — M791 Myalgia, unspecified site: Principal | ICD-10-CM

## 2022-01-01 DIAGNOSIS — M3219 Other organ or system involvement in systemic lupus erythematosus: Principal | ICD-10-CM

## 2022-01-01 DIAGNOSIS — Z792 Long term (current) use of antibiotics: Principal | ICD-10-CM

## 2022-01-01 MED ORDER — CYCLOBENZAPRINE 5 MG TABLET
ORAL_TABLET | Freq: Every evening | ORAL | 2 refills | 30 days | Status: CP
Start: 2022-01-01 — End: ?

## 2022-01-05 ENCOUNTER — Ambulatory Visit
Admit: 2022-01-05 | Discharge: 2022-01-06 | Payer: PRIVATE HEALTH INSURANCE | Attending: Student Health | Primary: Student Health

## 2022-01-05 DIAGNOSIS — Z792 Long term (current) use of antibiotics: Principal | ICD-10-CM

## 2022-01-05 DIAGNOSIS — M6740 Ganglion, unspecified site: Principal | ICD-10-CM

## 2022-01-05 DIAGNOSIS — T8130XA Disruption of wound, unspecified, initial encounter: Principal | ICD-10-CM

## 2022-01-07 ENCOUNTER — Ambulatory Visit
Admit: 2022-01-07 | Discharge: 2022-01-08 | Payer: PRIVATE HEALTH INSURANCE | Attending: "Endocrinology | Primary: "Endocrinology

## 2022-01-07 DIAGNOSIS — E890 Postprocedural hypothyroidism: Principal | ICD-10-CM

## 2022-01-07 DIAGNOSIS — F419 Anxiety disorder, unspecified: Principal | ICD-10-CM

## 2022-01-07 DIAGNOSIS — M3219 Other organ or system involvement in systemic lupus erythematosus: Principal | ICD-10-CM

## 2022-01-07 DIAGNOSIS — F32A Anxiety and depression: Principal | ICD-10-CM

## 2022-01-07 MED ORDER — LEVOTHYROXINE 100 MCG TABLET
ORAL_TABLET | Freq: Every day | ORAL | 3 refills | 90 days | Status: CP
Start: 2022-01-07 — End: 2022-04-07

## 2022-01-16 ENCOUNTER — Ambulatory Visit
Admit: 2022-01-16 | Discharge: 2022-01-17 | Payer: PRIVATE HEALTH INSURANCE | Attending: Orthopaedic Surgery | Primary: Orthopaedic Surgery

## 2022-01-28 DIAGNOSIS — R52 Pain, unspecified: Principal | ICD-10-CM

## 2022-01-28 DIAGNOSIS — M3219 Other organ or system involvement in systemic lupus erythematosus: Principal | ICD-10-CM

## 2022-01-28 MED ORDER — MELOXICAM 15 MG TABLET
ORAL_TABLET | Freq: Every day | ORAL | 1 refills | 30 days | Status: CP
Start: 2022-01-28 — End: 2023-01-28

## 2022-01-29 ENCOUNTER — Ambulatory Visit
Admit: 2022-01-29 | Payer: PRIVATE HEALTH INSURANCE | Attending: Rehabilitative and Restorative Service Providers" | Primary: Rehabilitative and Restorative Service Providers"

## 2022-01-29 ENCOUNTER — Ambulatory Visit
Admit: 2022-01-29 | Discharge: 2022-02-27 | Payer: PRIVATE HEALTH INSURANCE | Attending: Rehabilitative and Restorative Service Providers" | Primary: Rehabilitative and Restorative Service Providers"

## 2022-03-04 ENCOUNTER — Ambulatory Visit
Admit: 2022-03-04 | Payer: PRIVATE HEALTH INSURANCE | Attending: Rehabilitative and Restorative Service Providers" | Primary: Rehabilitative and Restorative Service Providers"

## 2022-04-09 DIAGNOSIS — M3219 Other organ or system involvement in systemic lupus erythematosus: Principal | ICD-10-CM

## 2022-04-09 DIAGNOSIS — R52 Pain, unspecified: Principal | ICD-10-CM

## 2022-04-09 MED ORDER — MELOXICAM 15 MG TABLET
ORAL_TABLET | ORAL | 3 refills | 0 days
Start: 2022-04-09 — End: ?

## 2022-04-10 MED ORDER — MELOXICAM 15 MG TABLET
ORAL_TABLET | ORAL | 0 refills | 0 days | Status: CP
Start: 2022-04-10 — End: ?

## 2022-04-13 DIAGNOSIS — M3219 Other organ or system involvement in systemic lupus erythematosus: Principal | ICD-10-CM

## 2022-04-13 MED ORDER — HYDROXYCHLOROQUINE 200 MG TABLET
ORAL_TABLET | Freq: Two times a day (BID) | ORAL | 6 refills | 90 days
Start: 2022-04-13 — End: ?

## 2022-04-14 MED ORDER — HYDROXYCHLOROQUINE 200 MG TABLET
ORAL_TABLET | Freq: Two times a day (BID) | ORAL | 6 refills | 90 days | Status: CP
Start: 2022-04-14 — End: ?

## 2022-04-21 DIAGNOSIS — M3219 Other organ or system involvement in systemic lupus erythematosus: Principal | ICD-10-CM

## 2022-04-21 DIAGNOSIS — R52 Pain, unspecified: Principal | ICD-10-CM

## 2022-04-21 MED ORDER — MELOXICAM 15 MG TABLET
ORAL_TABLET | Freq: Every day | ORAL | 0 refills | 90 days | Status: CP
Start: 2022-04-21 — End: ?

## 2022-04-21 MED ORDER — HYDROXYCHLOROQUINE 200 MG TABLET
ORAL_TABLET | Freq: Two times a day (BID) | ORAL | 3 refills | 90 days | Status: CP
Start: 2022-04-21 — End: ?

## 2022-04-30 DIAGNOSIS — M3219 Other organ or system involvement in systemic lupus erythematosus: Principal | ICD-10-CM

## 2022-07-07 DIAGNOSIS — E890 Postprocedural hypothyroidism: Principal | ICD-10-CM

## 2022-07-24 DIAGNOSIS — F32A Anxiety and depression: Principal | ICD-10-CM

## 2022-07-24 DIAGNOSIS — G43411 Hemiplegic migraine, intractable, with status migrainosus: Principal | ICD-10-CM

## 2022-07-24 DIAGNOSIS — F419 Anxiety disorder, unspecified: Principal | ICD-10-CM

## 2022-08-11 MED ORDER — LISDEXAMFETAMINE 40 MG CAPSULE
ORAL_CAPSULE | Freq: Every morning | ORAL | 0 refills | 30 days | Status: CP
Start: 2022-08-11 — End: ?

## 2022-08-11 MED ORDER — TRAZODONE 50 MG TABLET
ORAL_TABLET | Freq: Every evening | ORAL | 0 refills | 90 days | Status: CP
Start: 2022-08-11 — End: 2023-02-07

## 2022-08-11 MED ORDER — VENLAFAXINE ER 225 MG TABLET,EXTENDED RELEASE 24 HR
ORAL_TABLET | Freq: Every day | ORAL | 0 refills | 90 days | Status: CP
Start: 2022-08-11 — End: ?

## 2022-08-14 ENCOUNTER — Ambulatory Visit: Admit: 2022-08-14 | Discharge: 2022-08-15 | Payer: PRIVATE HEALTH INSURANCE

## 2022-08-14 DIAGNOSIS — F32A Anxiety and depression: Principal | ICD-10-CM

## 2022-08-14 DIAGNOSIS — F419 Anxiety disorder, unspecified: Principal | ICD-10-CM

## 2022-08-14 MED ORDER — DEXTROAMPHETAMINE-AMPHETAMINE ER 20 MG 24HR CAPSULE,EXTEND RELEASE
ORAL_CAPSULE | Freq: Every morning | ORAL | 0 refills | 30 days | Status: CP
Start: 2022-08-14 — End: ?

## 2022-08-18 ENCOUNTER — Ambulatory Visit
Admit: 2022-08-18 | Discharge: 2022-08-19 | Payer: PRIVATE HEALTH INSURANCE | Attending: Internal Medicine | Primary: Internal Medicine

## 2022-08-18 DIAGNOSIS — M3219 Other organ or system involvement in systemic lupus erythematosus: Principal | ICD-10-CM

## 2022-08-19 DIAGNOSIS — E890 Postprocedural hypothyroidism: Principal | ICD-10-CM

## 2022-08-19 MED ORDER — LEVOTHYROXINE 100 MCG TABLET
ORAL_TABLET | Freq: Every day | ORAL | 3 refills | 90 days | Status: CP
Start: 2022-08-19 — End: 2023-08-14

## 2022-08-24 ENCOUNTER — Ambulatory Visit: Admit: 2022-08-24 | Discharge: 2022-08-25 | Payer: MEDICARE

## 2022-08-24 DIAGNOSIS — G43109 Migraine with aura, not intractable, without status migrainosus: Principal | ICD-10-CM

## 2022-08-24 DIAGNOSIS — G43409 Hemiplegic migraine, not intractable, without status migrainosus: Principal | ICD-10-CM

## 2022-08-24 DIAGNOSIS — G43411 Hemiplegic migraine, intractable, with status migrainosus: Principal | ICD-10-CM

## 2022-08-24 MED ORDER — RIMEGEPANT 75 MG DISINTEGRATING TABLET
ORAL_TABLET | Freq: Every day | ORAL | 5 refills | 10 days | Status: CP | PRN
Start: 2022-08-24 — End: ?

## 2022-08-26 DIAGNOSIS — M3219 Other organ or system involvement in systemic lupus erythematosus: Principal | ICD-10-CM

## 2022-09-11 ENCOUNTER — Ambulatory Visit: Admit: 2022-09-11 | Payer: MEDICARE | Attending: Family Medicine | Primary: Family Medicine

## 2022-09-11 ENCOUNTER — Telehealth: Admit: 2022-09-11 | Discharge: 2022-09-12 | Payer: MEDICARE

## 2022-09-11 DIAGNOSIS — F419 Anxiety disorder, unspecified: Principal | ICD-10-CM

## 2022-09-11 DIAGNOSIS — F909 Attention-deficit hyperactivity disorder, unspecified type: Principal | ICD-10-CM

## 2022-09-11 DIAGNOSIS — F32A Depression, unspecified depression type: Principal | ICD-10-CM

## 2022-09-11 MED ORDER — DEXTROAMPHETAMINE-AMPHETAMINE ER 30 MG 24HR CAPSULE,EXTEND RELEASE
ORAL_CAPSULE | Freq: Every morning | ORAL | 0 refills | 30 days | Status: CP
Start: 2022-09-11 — End: ?

## 2022-09-11 MED ORDER — GABAPENTIN 300 MG CAPSULE
ORAL_CAPSULE | Freq: Every evening | ORAL | 0 refills | 30 days | Status: CP
Start: 2022-09-11 — End: 2022-10-11

## 2022-09-11 MED ORDER — VENLAFAXINE ER 225 MG TABLET,EXTENDED RELEASE 24 HR
ORAL_TABLET | Freq: Every day | ORAL | 0 refills | 90 days | Status: CP
Start: 2022-09-11 — End: ?

## 2022-09-11 MED ORDER — TRAZODONE 100 MG TABLET
ORAL_TABLET | Freq: Every evening | ORAL | 0 refills | 30 days | Status: CP
Start: 2022-09-11 — End: 2022-10-11

## 2022-10-13 MED ORDER — TRAZODONE 100 MG TABLET
ORAL_TABLET | Freq: Every evening | ORAL | 0 refills | 30 days
Start: 2022-10-13 — End: 2022-11-12

## 2022-10-13 MED ORDER — GABAPENTIN 300 MG CAPSULE
ORAL_CAPSULE | Freq: Every evening | ORAL | 0 refills | 30 days
Start: 2022-10-13 — End: 2022-11-12

## 2022-10-14 MED ORDER — TRAZODONE 100 MG TABLET
ORAL_TABLET | Freq: Every evening | ORAL | 0 refills | 30 days
Start: 2022-10-14 — End: 2022-11-13

## 2022-10-14 MED ORDER — GABAPENTIN 300 MG CAPSULE
ORAL_CAPSULE | Freq: Every evening | ORAL | 0 refills | 30 days
Start: 2022-10-14 — End: 2022-11-13

## 2022-10-15 MED ORDER — GABAPENTIN 300 MG CAPSULE
ORAL_CAPSULE | Freq: Every evening | ORAL | 0 refills | 30 days
Start: 2022-10-15 — End: 2022-11-14

## 2022-10-15 MED ORDER — TRAZODONE 100 MG TABLET
ORAL_TABLET | Freq: Every evening | ORAL | 0 refills | 30 days
Start: 2022-10-15 — End: 2022-11-14

## 2022-10-15 MED ORDER — DEXTROAMPHETAMINE-AMPHETAMINE ER 30 MG 24HR CAPSULE,EXTEND RELEASE
ORAL_CAPSULE | Freq: Every morning | ORAL | 0 refills | 30 days
Start: 2022-10-15 — End: ?

## 2022-10-16 MED ORDER — DEXTROAMPHETAMINE-AMPHETAMINE ER 30 MG 24HR CAPSULE,EXTEND RELEASE
ORAL_CAPSULE | Freq: Every morning | ORAL | 0 refills | 30 days | Status: CP
Start: 2022-10-16 — End: ?

## 2022-10-16 MED ORDER — GABAPENTIN 300 MG CAPSULE
ORAL_CAPSULE | Freq: Every evening | ORAL | 0 refills | 30 days | Status: CP
Start: 2022-10-16 — End: 2022-11-15

## 2022-10-16 MED ORDER — TRAZODONE 100 MG TABLET
ORAL_TABLET | Freq: Every evening | ORAL | 0 refills | 30 days | Status: CP
Start: 2022-10-16 — End: 2022-11-15

## 2022-11-10 MED ORDER — GABAPENTIN 300 MG CAPSULE
ORAL_CAPSULE | Freq: Every evening | ORAL | 0 refills | 30.00000 days | Status: CN
Start: 2022-11-10 — End: 2022-12-10

## 2022-11-10 MED ORDER — TRAZODONE 100 MG TABLET
ORAL_TABLET | Freq: Every evening | ORAL | 0 refills | 30.00000 days | Status: CN
Start: 2022-11-10 — End: 2022-12-10

## 2022-11-12 ENCOUNTER — Institutional Professional Consult (permissible substitution): Admit: 2022-11-12 | Discharge: 2022-11-13 | Payer: MEDICARE

## 2022-11-12 MED ORDER — ALBUTEROL SULFATE HFA 90 MCG/ACTUATION AEROSOL INHALER
Freq: Four times a day (QID) | RESPIRATORY_TRACT | 1 refills | 0 days | Status: CP | PRN
Start: 2022-11-12 — End: 2022-12-12

## 2022-11-12 MED ORDER — FLUTICASONE PROPIONATE 115 MCG-SALMETEROL 21 MCG/ACTUATION HFA INHALER
Freq: Two times a day (BID) | RESPIRATORY_TRACT | 0 refills | 30 days | Status: CP
Start: 2022-11-12 — End: 2023-11-12

## 2022-11-12 MED ORDER — AEROCHAMBER MV SPACER
Freq: Every day | 0 refills | 1 days | Status: CP
Start: 2022-11-12 — End: ?

## 2022-11-12 MED ORDER — PROMETHAZINE-DM 6.25 MG-15 MG/5 ML ORAL SYRUP
Freq: Three times a day (TID) | ORAL | 0 refills | 5 days | Status: CP | PRN
Start: 2022-11-12 — End: 2022-11-17

## 2022-11-12 MED ORDER — DEXTROAMPHETAMINE-AMPHETAMINE ER 30 MG 24HR CAPSULE,EXTEND RELEASE
ORAL_CAPSULE | Freq: Every morning | ORAL | 0 refills | 30 days
Start: 2022-11-12 — End: ?

## 2022-11-13 MED ORDER — DEXTROAMPHETAMINE-AMPHETAMINE ER 30 MG 24HR CAPSULE,EXTEND RELEASE
ORAL_CAPSULE | Freq: Every morning | ORAL | 0 refills | 30 days | Status: CP
Start: 2022-11-13 — End: 2022-12-12

## 2022-12-01 MED ORDER — THYROID (PORK) 90 MG TABLET
ORAL_TABLET | Freq: Two times a day (BID) | ORAL | 3 refills | 90 days
Start: 2022-12-01 — End: 2023-12-01

## 2022-12-01 MED ORDER — ARMOUR THYROID 90 MG TABLET
ORAL_TABLET | 0 refills | 0 days
Start: 2022-12-01 — End: ?

## 2022-12-02 MED ORDER — THYROID (PORK) 90 MG TABLET
ORAL_TABLET | Freq: Two times a day (BID) | ORAL | 3 refills | 90 days | Status: CP
Start: 2022-12-02 — End: 2023-12-02

## 2022-12-02 MED ORDER — ARMOUR THYROID 90 MG TABLET
ORAL_TABLET | 0 refills | 0 days
Start: 2022-12-02 — End: ?

## 2022-12-13 MED ORDER — DEXTROAMPHETAMINE-AMPHETAMINE ER 30 MG 24HR CAPSULE,EXTEND RELEASE
ORAL_CAPSULE | Freq: Every morning | ORAL | 0 refills | 30 days | Status: CP
Start: 2022-12-13 — End: 2023-01-11

## 2022-12-14 MED ORDER — VENLAFAXINE ER 225 MG TABLET,EXTENDED RELEASE 24 HR
ORAL_TABLET | Freq: Every day | ORAL | 0 refills | 90.00000 days | Status: CP
Start: 2022-12-14 — End: ?

## 2022-12-17 ENCOUNTER — Ambulatory Visit: Admit: 2022-12-17 | Discharge: 2022-12-18 | Payer: MEDICARE | Attending: Family | Primary: Family

## 2022-12-17 DIAGNOSIS — M3219 Other organ or system involvement in systemic lupus erythematosus: Principal | ICD-10-CM

## 2022-12-23 MED ORDER — GABAPENTIN 300 MG CAPSULE
ORAL_CAPSULE | Freq: Every evening | ORAL | 0 refills | 30 days
Start: 2022-12-23 — End: 2023-01-22

## 2022-12-25 MED ORDER — GABAPENTIN 300 MG CAPSULE
ORAL_CAPSULE | Freq: Every evening | ORAL | 0 refills | 30 days | Status: CP
Start: 2022-12-25 — End: 2023-01-24

## 2022-12-29 DIAGNOSIS — M3219 Other organ or system involvement in systemic lupus erythematosus: Principal | ICD-10-CM

## 2022-12-29 MED ORDER — TRAZODONE 50 MG TABLET
ORAL_TABLET | 0 refills | 0 days
Start: 2022-12-29 — End: ?

## 2022-12-29 MED ORDER — PREDNISONE 5 MG TABLET
ORAL_TABLET | 6 refills | 0 days
Start: 2022-12-29 — End: ?

## 2022-12-30 MED ORDER — PREDNISONE 5 MG TABLET
ORAL_TABLET | Freq: Every day | ORAL | 0 refills | 90 days | Status: CP
Start: 2022-12-30 — End: 2023-12-30

## 2023-01-05 ENCOUNTER — Ambulatory Visit: Admit: 2023-01-05 | Discharge: 2023-01-06 | Payer: MEDICARE

## 2023-01-05 DIAGNOSIS — H5213 Myopia, bilateral: Principal | ICD-10-CM

## 2023-01-06 MED ORDER — TRAZODONE 100 MG TABLET
ORAL_TABLET | Freq: Every evening | ORAL | 0 refills | 30 days
Start: 2023-01-06 — End: 2023-02-05

## 2023-01-07 MED ORDER — TRAZODONE 100 MG TABLET
ORAL_TABLET | Freq: Every evening | ORAL | 0 refills | 30 days | Status: CP
Start: 2023-01-07 — End: 2023-02-06

## 2023-01-12 MED ORDER — DEXTROAMPHETAMINE-AMPHETAMINE ER 30 MG 24HR CAPSULE,EXTEND RELEASE
ORAL_CAPSULE | Freq: Every morning | ORAL | 0 refills | 30 days | Status: CP
Start: 2023-01-12 — End: 2023-02-10

## 2023-01-25 MED ORDER — GABAPENTIN 300 MG CAPSULE
ORAL_CAPSULE | Freq: Every evening | ORAL | 0 refills | 30 days
Start: 2023-01-25 — End: 2023-02-24

## 2023-01-26 MED ORDER — GABAPENTIN 300 MG CAPSULE
ORAL_CAPSULE | Freq: Every evening | ORAL | 0 refills | 30 days | Status: CP
Start: 2023-01-26 — End: 2023-02-25

## 2023-02-01 MED ORDER — ADVAIR HFA 115 MCG-21 MCG/ACTUATION AEROSOL INHALER
Freq: Two times a day (BID) | RESPIRATORY_TRACT | 0 refills | 30 days | Status: CP
Start: 2023-02-01 — End: ?

## 2023-02-03 ENCOUNTER — Ambulatory Visit: Admit: 2023-02-03 | Discharge: 2023-02-04 | Payer: MEDICARE

## 2023-02-12 ENCOUNTER — Telehealth: Admit: 2023-02-12 | Discharge: 2023-02-13 | Payer: MEDICARE

## 2023-02-12 DIAGNOSIS — F419 Anxiety disorder, unspecified: Principal | ICD-10-CM

## 2023-02-12 DIAGNOSIS — F909 Attention-deficit hyperactivity disorder, unspecified type: Principal | ICD-10-CM

## 2023-02-12 DIAGNOSIS — F32A Depression, unspecified depression type: Principal | ICD-10-CM

## 2023-02-12 MED ORDER — DEXTROAMPHETAMINE-AMPHETAMINE ER 20 MG 24HR CAPSULE,EXTEND RELEASE
ORAL_CAPSULE | Freq: Every morning | ORAL | 0 refills | 30 days | Status: CP
Start: 2023-02-12 — End: 2023-03-13

## 2023-02-12 MED ORDER — GABAPENTIN 300 MG CAPSULE
ORAL_CAPSULE | 1 refills | 0 days | Status: CP
Start: 2023-02-12 — End: ?

## 2023-02-12 MED ORDER — VENLAFAXINE ER 225 MG TABLET,EXTENDED RELEASE 24 HR
ORAL_TABLET | Freq: Every day | ORAL | 1 refills | 90 days | Status: CP
Start: 2023-02-12 — End: ?

## 2023-02-12 MED ORDER — TRAZODONE 100 MG TABLET
ORAL_TABLET | Freq: Every evening | ORAL | 1 refills | 90 days | Status: CP
Start: 2023-02-12 — End: 2023-08-11

## 2023-02-16 ENCOUNTER — Ambulatory Visit: Admit: 2023-02-16 | Discharge: 2023-02-17 | Payer: MEDICARE

## 2023-03-14 MED ORDER — DEXTROAMPHETAMINE-AMPHETAMINE ER 20 MG 24HR CAPSULE,EXTEND RELEASE
ORAL_CAPSULE | Freq: Every morning | ORAL | 0 refills | 30 days | Status: CP
Start: 2023-03-14 — End: 2023-04-12

## 2023-03-18 MED ORDER — DEXTROAMPHETAMINE-AMPHETAMINE ER 10 MG 24HR CAPSULE,EXTEND RELEASE
ORAL_CAPSULE | Freq: Every morning | ORAL | 0 refills | 30 days | Status: CP
Start: 2023-03-18 — End: 2023-04-17

## 2023-03-18 MED ORDER — DEXTROAMPHETAMINE-AMPHETAMINE ER 30 MG 24HR CAPSULE,EXTEND RELEASE
ORAL_CAPSULE | Freq: Every morning | ORAL | 0 refills | 30 days | Status: CP
Start: 2023-03-18 — End: 2023-04-17

## 2023-03-23 ENCOUNTER — Ambulatory Visit: Admit: 2023-03-23 | Discharge: 2023-03-23 | Payer: MEDICARE

## 2023-03-23 ENCOUNTER — Ambulatory Visit: Admit: 2023-03-23 | Discharge: 2023-03-23 | Payer: MEDICARE | Attending: Internal Medicine | Primary: Internal Medicine

## 2023-03-23 DIAGNOSIS — M3219 Other organ or system involvement in systemic lupus erythematosus: Principal | ICD-10-CM

## 2023-03-23 MED ORDER — PREDNISONE 5 MG TABLET
ORAL_TABLET | Freq: Every day | ORAL | 0 refills | 90 days | Status: CP
Start: 2023-03-23 — End: 2024-03-22

## 2023-03-23 MED ORDER — PREDNISONE 1 MG TABLET
ORAL_TABLET | ORAL | 1 refills | 33 days | Status: CP
Start: 2023-03-23 — End: 2023-07-21

## 2023-03-23 MED ORDER — HYDROXYCHLOROQUINE 200 MG TABLET
ORAL_TABLET | Freq: Two times a day (BID) | ORAL | 3 refills | 90 days | Status: CN
Start: 2023-03-23 — End: ?

## 2023-03-23 MED ORDER — LEFLUNOMIDE 20 MG TABLET
ORAL_TABLET | Freq: Every day | ORAL | 1 refills | 90 days | Status: CP
Start: 2023-03-23 — End: 2024-03-22

## 2023-03-23 MED ORDER — MUPIROCIN 2 % TOPICAL OINTMENT
Freq: Three times a day (TID) | TOPICAL | 1 refills | 0 days | Status: CP | PRN
Start: 2023-03-23 — End: 2023-03-30

## 2023-04-02 DIAGNOSIS — M3219 Other organ or system involvement in systemic lupus erythematosus: Principal | ICD-10-CM

## 2023-04-02 DIAGNOSIS — R21 Rash and other nonspecific skin eruption: Principal | ICD-10-CM

## 2023-04-02 DIAGNOSIS — M419 Scoliosis, unspecified: Principal | ICD-10-CM

## 2023-04-07 ENCOUNTER — Ambulatory Visit: Admit: 2023-04-07 | Discharge: 2023-04-08 | Payer: MEDICARE

## 2023-04-07 DIAGNOSIS — M41125 Adolescent idiopathic scoliosis, thoracolumbar region: Principal | ICD-10-CM

## 2023-04-07 DIAGNOSIS — M546 Pain in thoracic spine: Principal | ICD-10-CM

## 2023-04-07 DIAGNOSIS — M545 Thoracolumbar back pain: Principal | ICD-10-CM

## 2023-04-07 DIAGNOSIS — M5416 Radiculopathy, lumbar region: Principal | ICD-10-CM

## 2023-04-13 MED ORDER — DEXTROAMPHETAMINE-AMPHETAMINE ER 20 MG 24HR CAPSULE,EXTEND RELEASE
ORAL_CAPSULE | Freq: Every morning | ORAL | 0 refills | 30 days | Status: CP
Start: 2023-04-13 — End: 2023-05-12

## 2023-04-17 MED ORDER — DEXTROAMPHETAMINE-AMPHETAMINE ER 30 MG 24HR CAPSULE,EXTEND RELEASE
ORAL_CAPSULE | Freq: Every morning | ORAL | 0 refills | 30 days | Status: CP
Start: 2023-04-17 — End: 2023-05-17

## 2023-04-17 MED ORDER — DEXTROAMPHETAMINE-AMPHETAMINE ER 10 MG 24HR CAPSULE,EXTEND RELEASE
ORAL_CAPSULE | Freq: Every morning | ORAL | 0 refills | 30 days | Status: CP
Start: 2023-04-17 — End: 2023-05-17

## 2023-04-21 ENCOUNTER — Ambulatory Visit
Admit: 2023-04-21 | Payer: MEDICARE | Attending: Rehabilitative and Restorative Service Providers" | Primary: Rehabilitative and Restorative Service Providers"

## 2023-04-21 ENCOUNTER — Ambulatory Visit
Admit: 2023-04-21 | Discharge: 2023-05-20 | Payer: MEDICARE | Attending: Rehabilitative and Restorative Service Providers" | Primary: Rehabilitative and Restorative Service Providers"

## 2023-04-21 MED ORDER — VENLAFAXINE ER 225 MG TABLET,EXTENDED RELEASE 24 HR
ORAL_TABLET | Freq: Every day | ORAL | 1 refills | 90 days
Start: 2023-04-21 — End: ?

## 2023-04-22 ENCOUNTER — Ambulatory Visit
Admit: 2023-04-22 | Discharge: 2023-04-23 | Payer: MEDICARE | Attending: Student in an Organized Health Care Education/Training Program | Primary: Student in an Organized Health Care Education/Training Program

## 2023-04-22 DIAGNOSIS — R238 Other skin changes: Principal | ICD-10-CM

## 2023-04-22 DIAGNOSIS — D1801 Hemangioma of skin and subcutaneous tissue: Principal | ICD-10-CM

## 2023-04-22 DIAGNOSIS — T50905A Adverse effect of unspecified drugs, medicaments and biological substances, initial encounter: Principal | ICD-10-CM

## 2023-04-22 DIAGNOSIS — R234 Changes in skin texture: Principal | ICD-10-CM

## 2023-04-22 DIAGNOSIS — L819 Disorder of pigmentation, unspecified: Principal | ICD-10-CM

## 2023-04-22 DIAGNOSIS — L814 Other melanin hyperpigmentation: Principal | ICD-10-CM

## 2023-04-22 MED ORDER — CLINDAMYCIN PHOSPHATE 1 % TOPICAL SOLUTION
Freq: Two times a day (BID) | TOPICAL | 11 refills | 0.00000 days | Status: CN
Start: 2023-04-22 — End: ?

## 2023-04-22 MED ORDER — DEXTROAMPHETAMINE-AMPHETAMINE 20 MG TABLET
ORAL_TABLET | 0 refills | 0 days | Status: CP
Start: 2023-04-22 — End: ?

## 2023-04-22 MED ORDER — VENLAFAXINE ER 225 MG TABLET,EXTENDED RELEASE 24 HR
ORAL_TABLET | Freq: Every day | ORAL | 1 refills | 90 days | Status: CP
Start: 2023-04-22 — End: ?

## 2023-04-22 MED ORDER — CLOBETASOL 0.05 % SCALP SOLUTION
Freq: Two times a day (BID) | TOPICAL | 5 refills | 0.00000 days | Status: CP
Start: 2023-04-22 — End: 2024-04-21

## 2023-04-22 MED ORDER — TRIAMCINOLONE ACETONIDE 0.1 % TOPICAL OINTMENT
Freq: Two times a day (BID) | TOPICAL | 0 refills | 0.00000 days | Status: CP
Start: 2023-04-22 — End: 2024-04-21

## 2023-04-26 DIAGNOSIS — M3219 Other organ or system involvement in systemic lupus erythematosus: Principal | ICD-10-CM

## 2023-04-26 MED ORDER — HYDROXYCHLOROQUINE 200 MG TABLET
ORAL_TABLET | Freq: Two times a day (BID) | ORAL | 6 refills | 90 days
Start: 2023-04-26 — End: ?

## 2023-05-03 MED ORDER — GABAPENTIN 300 MG CAPSULE
ORAL_CAPSULE | 1 refills | 0 days | Status: CP
Start: 2023-05-03 — End: ?

## 2023-05-07 ENCOUNTER — Telehealth: Admit: 2023-05-07 | Discharge: 2023-05-08 | Payer: MEDICARE

## 2023-05-07 DIAGNOSIS — F909 Attention-deficit hyperactivity disorder, unspecified type: Principal | ICD-10-CM

## 2023-05-07 DIAGNOSIS — F32A Depression, unspecified depression type: Principal | ICD-10-CM

## 2023-05-07 DIAGNOSIS — F419 Anxiety disorder, unspecified: Principal | ICD-10-CM

## 2023-05-07 MED ORDER — DEXTROAMPHETAMINE-AMPHETAMINE ER 20 MG 24HR CAPSULE,EXTEND RELEASE
ORAL_CAPSULE | Freq: Every morning | ORAL | 0 refills | 30 days | Status: CP
Start: 2023-05-07 — End: ?

## 2023-05-17 DIAGNOSIS — M3219 Other organ or system involvement in systemic lupus erythematosus: Principal | ICD-10-CM

## 2023-05-17 MED ORDER — DEXTROAMPHETAMINE-AMPHETAMINE ER 30 MG 24HR CAPSULE,EXTEND RELEASE
ORAL_CAPSULE | Freq: Every morning | ORAL | 0 refills | 30 days | Status: CP
Start: 2023-05-17 — End: 2023-06-16

## 2023-05-17 MED ORDER — DEXTROAMPHETAMINE-AMPHETAMINE ER 10 MG 24HR CAPSULE,EXTEND RELEASE
ORAL_CAPSULE | Freq: Every morning | ORAL | 0 refills | 30 days | Status: CP
Start: 2023-05-17 — End: 2023-06-16

## 2023-05-17 MED ORDER — LEFLUNOMIDE 20 MG TABLET
ORAL_TABLET | Freq: Every day | ORAL | 0 refills | 90 days | Status: CP
Start: 2023-05-17 — End: 2024-05-16

## 2023-05-18 ENCOUNTER — Ambulatory Visit
Admission: RE | Admit: 2023-05-18 | Discharge: 2023-05-18 | Disposition: A | Discharge status: Home / Self Care | Payer: Managed Care, Other (non HMO) | Source: Ambulatory Visit | Attending: Physician Assistant | Admitting: Physician Assistant

## 2023-05-18 ENCOUNTER — Other Ambulatory Visit: Payer: Self-pay | Admitting: Physician Assistant

## 2023-05-18 DIAGNOSIS — R0781 Pleurodynia: Secondary | ICD-10-CM

## 2023-05-31 ENCOUNTER — Ambulatory Visit
Admit: 2023-05-31 | Payer: MEDICARE | Attending: Rehabilitative and Restorative Service Providers" | Primary: Rehabilitative and Restorative Service Providers"

## 2023-06-02 ENCOUNTER — Ambulatory Visit: Admit: 2023-06-02 | Discharge: 2023-06-03 | Payer: MEDICARE

## 2023-06-02 DIAGNOSIS — M41125 Adolescent idiopathic scoliosis, thoracolumbar region: Principal | ICD-10-CM

## 2023-06-02 DIAGNOSIS — M5416 Radiculopathy, lumbar region: Principal | ICD-10-CM

## 2023-06-02 DIAGNOSIS — M545 Thoracolumbar back pain: Principal | ICD-10-CM

## 2023-06-02 DIAGNOSIS — M546 Pain in thoracic spine: Principal | ICD-10-CM

## 2023-06-02 MED ORDER — METHOCARBAMOL 500 MG TABLET
ORAL_TABLET | Freq: Four times a day (QID) | ORAL | 0 refills | 10.00 days | Status: CP | PRN
Start: 2023-06-02 — End: ?

## 2023-06-03 ENCOUNTER — Ambulatory Visit
Admit: 2023-06-03 | Discharge: 2023-06-04 | Payer: MEDICARE | Attending: Student in an Organized Health Care Education/Training Program | Primary: Student in an Organized Health Care Education/Training Program

## 2023-06-03 DIAGNOSIS — R238 Other skin changes: Principal | ICD-10-CM

## 2023-06-03 DIAGNOSIS — L819 Disorder of pigmentation, unspecified: Principal | ICD-10-CM

## 2023-06-03 DIAGNOSIS — T50905A Adverse effect of unspecified drugs, medicaments and biological substances, initial encounter: Principal | ICD-10-CM

## 2023-06-03 DIAGNOSIS — R234 Changes in skin texture: Principal | ICD-10-CM

## 2023-06-03 DIAGNOSIS — L853 Xerosis cutis: Principal | ICD-10-CM

## 2023-06-03 DIAGNOSIS — L309 Dermatitis, unspecified: Principal | ICD-10-CM

## 2023-06-03 MED ORDER — HYDROQUINONE 8 % TOPICAL EMULSION
2 refills | 0.00 days | Status: CP
Start: 2023-06-03 — End: ?

## 2023-06-03 MED ORDER — CLOBETASOL 0.05 % TOPICAL OINTMENT
1 refills | 0.00 days | Status: CP
Start: 2023-06-03 — End: ?

## 2023-06-04 ENCOUNTER — Telehealth: Admit: 2023-06-04 | Discharge: 2023-06-05 | Payer: MEDICARE

## 2023-06-04 DIAGNOSIS — F32A Depression, unspecified depression type: Principal | ICD-10-CM

## 2023-06-04 DIAGNOSIS — F909 Attention-deficit hyperactivity disorder, unspecified type: Principal | ICD-10-CM

## 2023-06-04 MED ORDER — DEXTROAMPHETAMINE-AMPHETAMINE ER 20 MG 24HR CAPSULE,EXTEND RELEASE
ORAL_CAPSULE | Freq: Every morning | ORAL | 0 refills | 30.00 days | Status: CP
Start: 2023-06-04 — End: ?

## 2023-06-30 ENCOUNTER — Ambulatory Visit: Admit: 2023-06-30 | Discharge: 2023-06-30 | Payer: PRIVATE HEALTH INSURANCE

## 2023-06-30 ENCOUNTER — Inpatient Hospital Stay: Admit: 2023-06-30 | Discharge: 2023-06-30 | Payer: PRIVATE HEALTH INSURANCE

## 2023-06-30 ENCOUNTER — Ambulatory Visit: Admit: 2023-06-30 | Discharge: 2023-06-30 | Payer: PRIVATE HEALTH INSURANCE | Attending: Family | Primary: Family

## 2023-06-30 DIAGNOSIS — M3219 Other organ or system involvement in systemic lupus erythematosus: Principal | ICD-10-CM

## 2023-06-30 DIAGNOSIS — R52 Pain, unspecified: Principal | ICD-10-CM

## 2023-06-30 MED ORDER — LEFLUNOMIDE 20 MG TABLET
ORAL_TABLET | Freq: Every day | ORAL | 0 refills | 90.00 days | Status: CP
Start: 2023-06-30 — End: 2024-06-29

## 2023-07-02 MED ORDER — DEXTROAMPHETAMINE-AMPHETAMINE ER 20 MG 24HR CAPSULE,EXTEND RELEASE
ORAL_CAPSULE | Freq: Every morning | ORAL | 0 refills | 30.00 days | Status: CP
Start: 2023-07-02 — End: ?

## 2023-07-06 ENCOUNTER — Encounter
Admit: 2023-07-06 | Discharge: 2023-07-07 | Payer: PRIVATE HEALTH INSURANCE | Attending: Mental Health | Primary: Mental Health

## 2023-07-06 DIAGNOSIS — F909 Attention-deficit hyperactivity disorder, unspecified type: Principal | ICD-10-CM

## 2023-07-06 DIAGNOSIS — F419 Anxiety disorder, unspecified: Principal | ICD-10-CM

## 2023-07-13 ENCOUNTER — Inpatient Hospital Stay: Admit: 2023-07-13 | Discharge: 2023-07-14 | Payer: PRIVATE HEALTH INSURANCE

## 2023-07-13 ENCOUNTER — Ambulatory Visit: Admit: 2023-07-13 | Discharge: 2023-07-14 | Payer: PRIVATE HEALTH INSURANCE

## 2023-07-13 DIAGNOSIS — M41125 Adolescent idiopathic scoliosis, thoracolumbar region: Principal | ICD-10-CM

## 2023-07-13 DIAGNOSIS — M5416 Radiculopathy, lumbar region: Principal | ICD-10-CM

## 2023-07-13 DIAGNOSIS — M545 Thoracolumbar back pain: Principal | ICD-10-CM

## 2023-07-13 DIAGNOSIS — M546 Pain in thoracic spine: Principal | ICD-10-CM

## 2023-07-13 MED ORDER — METHOCARBAMOL 500 MG TABLET
ORAL_TABLET | Freq: Every evening | ORAL | 0 refills | 30.00 days | Status: CP | PRN
Start: 2023-07-13 — End: ?

## 2023-07-20 ENCOUNTER — Encounter
Admit: 2023-07-20 | Discharge: 2023-07-21 | Payer: PRIVATE HEALTH INSURANCE | Attending: Mental Health | Primary: Mental Health

## 2023-07-20 DIAGNOSIS — F909 Attention-deficit hyperactivity disorder, unspecified type: Principal | ICD-10-CM

## 2023-07-27 ENCOUNTER — Ambulatory Visit: Admit: 2023-07-27 | Discharge: 2023-07-28 | Payer: PRIVATE HEALTH INSURANCE

## 2023-07-27 ENCOUNTER — Encounter
Admit: 2023-07-27 | Discharge: 2023-07-28 | Payer: PRIVATE HEALTH INSURANCE | Attending: Mental Health | Primary: Mental Health

## 2023-07-27 DIAGNOSIS — M4120 Other idiopathic scoliosis, site unspecified: Principal | ICD-10-CM

## 2023-07-27 DIAGNOSIS — F909 Attention-deficit hyperactivity disorder, unspecified type: Principal | ICD-10-CM

## 2023-07-27 MED ORDER — DEXTROAMPHETAMINE-AMPHETAMINE ER 20 MG 24HR CAPSULE,EXTEND RELEASE
ORAL_CAPSULE | Freq: Every morning | ORAL | 0 refills | 30.00 days
Start: 2023-07-27 — End: ?

## 2023-07-28 MED ORDER — DEXTROAMPHETAMINE-AMPHETAMINE ER 20 MG 24HR CAPSULE,EXTEND RELEASE
ORAL_CAPSULE | Freq: Every morning | ORAL | 0 refills | 30.00 days | Status: CP
Start: 2023-07-28 — End: ?

## 2023-07-28 MED ORDER — TRAZODONE 100 MG TABLET
ORAL_TABLET | Freq: Every evening | ORAL | 1 refills | 90.00 days | Status: CP
Start: 2023-07-28 — End: 2024-01-24

## 2023-07-30 MED ORDER — DEXTROAMPHETAMINE-AMPHETAMINE ER 20 MG 24HR CAPSULE,EXTEND RELEASE
ORAL_CAPSULE | Freq: Every morning | ORAL | 0 refills | 30.00 days | Status: CP
Start: 2023-07-30 — End: 2023-08-29

## 2023-08-03 ENCOUNTER — Encounter
Admit: 2023-08-03 | Discharge: 2023-08-04 | Payer: PRIVATE HEALTH INSURANCE | Attending: Mental Health | Primary: Mental Health

## 2023-08-03 DIAGNOSIS — F32A Anxiety and depression: Principal | ICD-10-CM

## 2023-08-03 DIAGNOSIS — F909 Attention-deficit hyperactivity disorder, unspecified type: Principal | ICD-10-CM

## 2023-08-03 DIAGNOSIS — F419 Anxiety disorder, unspecified: Principal | ICD-10-CM

## 2023-08-10 ENCOUNTER — Encounter
Admit: 2023-08-10 | Discharge: 2023-08-11 | Payer: PRIVATE HEALTH INSURANCE | Attending: Mental Health | Primary: Mental Health

## 2023-08-10 DIAGNOSIS — F419 Anxiety disorder, unspecified: Principal | ICD-10-CM

## 2023-08-10 DIAGNOSIS — F909 Attention-deficit hyperactivity disorder, unspecified type: Principal | ICD-10-CM

## 2023-08-17 ENCOUNTER — Encounter
Admit: 2023-08-17 | Discharge: 2023-08-18 | Payer: PRIVATE HEALTH INSURANCE | Attending: Mental Health | Primary: Mental Health

## 2023-08-17 DIAGNOSIS — F419 Anxiety disorder, unspecified: Principal | ICD-10-CM

## 2023-08-17 DIAGNOSIS — F909 Attention-deficit hyperactivity disorder, unspecified type: Principal | ICD-10-CM

## 2023-08-20 DIAGNOSIS — M546 Pain in thoracic spine: Principal | ICD-10-CM

## 2023-08-20 DIAGNOSIS — M545 Thoracolumbar back pain: Principal | ICD-10-CM

## 2023-08-20 MED ORDER — LEVOTHYROXINE 100 MCG TABLET
ORAL_TABLET | Freq: Every day | ORAL | 3 refills | 90.00 days
Start: 2023-08-20 — End: 2024-08-14

## 2023-08-20 MED ORDER — GABAPENTIN 300 MG CAPSULE
ORAL_CAPSULE | 1 refills | 0.00 days
Start: 2023-08-20 — End: ?

## 2023-08-20 MED ORDER — METHOCARBAMOL 500 MG TABLET
ORAL_TABLET | Freq: Every evening | ORAL | 0 refills | 0.00 days | PRN
Start: 2023-08-20 — End: ?

## 2023-08-23 MED ORDER — GABAPENTIN 300 MG CAPSULE
ORAL_CAPSULE | 1 refills | 0.00 days | Status: CP
Start: 2023-08-23 — End: ?

## 2023-08-23 MED ORDER — DEXTROAMPHETAMINE-AMPHETAMINE ER 20 MG 24HR CAPSULE,EXTEND RELEASE
ORAL_CAPSULE | Freq: Every morning | ORAL | 0 refills | 30.00 days
Start: 2023-08-23 — End: ?

## 2023-08-23 MED ORDER — METHOCARBAMOL 500 MG TABLET
ORAL_TABLET | 0 refills | 0.00 days
Start: 2023-08-23 — End: ?

## 2023-08-23 MED ORDER — LEVOTHYROXINE 100 MCG TABLET
ORAL_TABLET | Freq: Every day | ORAL | 0 refills | 30.00 days | Status: CP
Start: 2023-08-23 — End: 2024-08-17

## 2023-08-24 MED ORDER — DEXTROAMPHETAMINE-AMPHETAMINE ER 20 MG 24HR CAPSULE,EXTEND RELEASE
ORAL_CAPSULE | Freq: Every morning | ORAL | 0 refills | 30.00 days | Status: CP
Start: 2023-08-24 — End: ?

## 2023-08-27 ENCOUNTER — Inpatient Hospital Stay: Admit: 2023-08-27 | Discharge: 2023-08-28 | Payer: MEDICARE

## 2023-08-27 ENCOUNTER — Encounter: Admit: 2023-08-27 | Discharge: 2023-08-28 | Payer: MEDICARE

## 2023-08-27 DIAGNOSIS — F419 Anxiety disorder, unspecified: Principal | ICD-10-CM

## 2023-08-27 DIAGNOSIS — F909 Attention-deficit hyperactivity disorder, unspecified type: Principal | ICD-10-CM

## 2023-08-27 MED ORDER — VENLAFAXINE ER 225 MG TABLET,EXTENDED RELEASE 24 HR
ORAL_TABLET | Freq: Every day | ORAL | 1 refills | 90.00 days | Status: CP
Start: 2023-08-27 — End: ?

## 2023-08-31 ENCOUNTER — Encounter: Admit: 2023-08-31 | Discharge: 2023-09-01 | Payer: MEDICARE | Attending: Mental Health | Primary: Mental Health

## 2023-08-31 DIAGNOSIS — F419 Anxiety disorder, unspecified: Principal | ICD-10-CM

## 2023-08-31 DIAGNOSIS — F902 Attention-deficit hyperactivity disorder, combined type: Principal | ICD-10-CM

## 2023-09-03 MED ORDER — LEVOTHYROXINE 100 MCG TABLET
ORAL_TABLET | Freq: Every day | ORAL | 0 refills | 30.00 days | Status: CP
Start: 2023-09-03 — End: 2024-08-28

## 2023-09-04 DIAGNOSIS — M3219 Other organ or system involvement in systemic lupus erythematosus: Principal | ICD-10-CM

## 2023-09-04 MED ORDER — PREDNISONE 1 MG TABLET
ORAL_TABLET | 1 refills | 0.00 days
Start: 2023-09-04 — End: ?

## 2023-09-04 MED ORDER — PREDNISONE 5 MG TABLET
ORAL_TABLET | Freq: Every day | ORAL | 0 refills | 0.00 days
Start: 2023-09-04 — End: ?

## 2023-09-06 ENCOUNTER — Ambulatory Visit: Admit: 2023-09-06 | Discharge: 2023-09-07 | Payer: MEDICARE | Attending: Family | Primary: Family

## 2023-09-06 DIAGNOSIS — R062 Wheezing: Principal | ICD-10-CM

## 2023-09-06 DIAGNOSIS — R059 Cough, unspecified type: Principal | ICD-10-CM

## 2023-09-06 MED ORDER — AZITHROMYCIN 250 MG TABLET
ORAL_TABLET | 0 refills | 0.00 days | Status: CP
Start: 2023-09-06 — End: ?

## 2023-09-06 MED ORDER — PREDNISONE 20 MG TABLET
ORAL_TABLET | 0 refills | 0.00 days | Status: CP
Start: 2023-09-06 — End: ?

## 2023-09-07 ENCOUNTER — Encounter: Admit: 2023-09-07 | Discharge: 2023-09-08 | Payer: MEDICARE | Attending: Mental Health | Primary: Mental Health

## 2023-09-07 DIAGNOSIS — F411 Generalized anxiety disorder: Principal | ICD-10-CM

## 2023-09-07 DIAGNOSIS — F902 Attention-deficit hyperactivity disorder, combined type: Principal | ICD-10-CM

## 2023-09-07 MED ORDER — PREDNISONE 5 MG TABLET
ORAL_TABLET | Freq: Every day | ORAL | 0 refills | 90.00 days | Status: CP
Start: 2023-09-07 — End: ?

## 2023-09-07 MED ORDER — PREDNISONE 1 MG TABLET
ORAL_TABLET | Freq: Every day | ORAL | 0 refills | 66.00 days | Status: CP
Start: 2023-09-07 — End: ?

## 2023-09-08 ENCOUNTER — Ambulatory Visit: Admit: 2023-09-08 | Discharge: 2023-09-09 | Payer: MEDICARE | Attending: "Endocrinology | Primary: "Endocrinology

## 2023-09-08 DIAGNOSIS — E039 Hypothyroidism, unspecified: Principal | ICD-10-CM

## 2023-09-08 DIAGNOSIS — N958 Other specified menopausal and perimenopausal disorders: Principal | ICD-10-CM

## 2023-09-08 DIAGNOSIS — M3219 Other organ or system involvement in systemic lupus erythematosus: Principal | ICD-10-CM

## 2023-09-14 ENCOUNTER — Encounter
Admit: 2023-09-14 | Discharge: 2023-09-15 | Payer: PRIVATE HEALTH INSURANCE | Attending: Mental Health | Primary: Mental Health

## 2023-09-14 DIAGNOSIS — F902 Attention-deficit hyperactivity disorder, combined type: Principal | ICD-10-CM

## 2023-09-14 DIAGNOSIS — F411 Generalized anxiety disorder: Principal | ICD-10-CM

## 2023-09-20 MED ORDER — DEXTROAMPHETAMINE-AMPHETAMINE ER 20 MG 24HR CAPSULE,EXTEND RELEASE
ORAL_CAPSULE | Freq: Every morning | ORAL | 0 refills | 30.00 days | Status: CP
Start: 2023-09-20 — End: ?

## 2023-09-21 ENCOUNTER — Ambulatory Visit: Admit: 2023-09-21 | Discharge: 2023-09-22 | Attending: Internal Medicine | Primary: Internal Medicine

## 2023-09-21 DIAGNOSIS — E559 Vitamin D deficiency, unspecified: Principal | ICD-10-CM

## 2023-09-21 DIAGNOSIS — M3219 Other organ or system involvement in systemic lupus erythematosus: Principal | ICD-10-CM

## 2023-09-21 DIAGNOSIS — M25522 Pain in left elbow: Principal | ICD-10-CM

## 2023-09-29 ENCOUNTER — Inpatient Hospital Stay: Admit: 2023-09-29 | Discharge: 2023-09-30

## 2023-09-29 DIAGNOSIS — M3219 Other organ or system involvement in systemic lupus erythematosus: Principal | ICD-10-CM

## 2023-09-29 DIAGNOSIS — E559 Vitamin D deficiency, unspecified: Principal | ICD-10-CM

## 2023-09-29 DIAGNOSIS — M25522 Pain in left elbow: Principal | ICD-10-CM

## 2023-10-05 ENCOUNTER — Encounter
Admit: 2023-10-05 | Discharge: 2023-10-06 | Payer: Medicare (Managed Care) | Attending: Mental Health | Primary: Mental Health

## 2023-10-05 DIAGNOSIS — F902 Attention-deficit hyperactivity disorder, combined type: Principal | ICD-10-CM

## 2023-10-05 DIAGNOSIS — F411 Generalized anxiety disorder: Principal | ICD-10-CM

## 2023-10-12 ENCOUNTER — Encounter: Admit: 2023-10-12 | Discharge: 2023-10-13 | Payer: MEDICARE | Attending: Mental Health | Primary: Mental Health

## 2023-10-12 DIAGNOSIS — F411 Generalized anxiety disorder: Principal | ICD-10-CM

## 2023-10-12 DIAGNOSIS — F902 Attention-deficit hyperactivity disorder, combined type: Principal | ICD-10-CM

## 2023-10-21 MED ORDER — LEVOTHYROXINE 100 MCG TABLET
ORAL_TABLET | Freq: Every day | ORAL | 0 refills | 30.00000 days | Status: CP
Start: 2023-10-21 — End: 2024-10-15

## 2023-10-22 ENCOUNTER — Encounter: Admit: 2023-10-22 | Discharge: 2023-10-23 | Payer: PRIVATE HEALTH INSURANCE

## 2023-10-22 DIAGNOSIS — F3342 Major depressive disorder, recurrent, in full remission: Principal | ICD-10-CM

## 2023-10-22 DIAGNOSIS — F902 Attention-deficit hyperactivity disorder, combined type: Principal | ICD-10-CM

## 2023-10-26 ENCOUNTER — Encounter
Admit: 2023-10-26 | Discharge: 2023-10-27 | Payer: PRIVATE HEALTH INSURANCE | Attending: Mental Health | Primary: Mental Health

## 2023-10-26 DIAGNOSIS — M3219 Other organ or system involvement in systemic lupus erythematosus: Principal | ICD-10-CM

## 2023-10-26 DIAGNOSIS — F902 Attention-deficit hyperactivity disorder, combined type: Principal | ICD-10-CM

## 2023-10-26 DIAGNOSIS — F411 Generalized anxiety disorder: Principal | ICD-10-CM

## 2023-10-26 MED ORDER — DEXTROAMPHETAMINE-AMPHETAMINE ER 20 MG 24HR CAPSULE,EXTEND RELEASE
ORAL_CAPSULE | Freq: Every morning | ORAL | 0 refills | 30.00000 days
Start: 2023-10-26 — End: ?

## 2023-10-26 MED ORDER — LEFLUNOMIDE 20 MG TABLET
ORAL_TABLET | Freq: Every day | ORAL | 3 refills | 0.00000 days
Start: 2023-10-26 — End: ?

## 2023-10-27 MED ORDER — DEXTROAMPHETAMINE-AMPHETAMINE ER 20 MG 24HR CAPSULE,EXTEND RELEASE
ORAL_CAPSULE | Freq: Every morning | ORAL | 0 refills | 30.00000 days | Status: CP
Start: 2023-10-27 — End: ?

## 2023-10-27 MED ORDER — LEFLUNOMIDE 20 MG TABLET
ORAL_TABLET | Freq: Every day | ORAL | 0 refills | 90.00000 days | Status: CP
Start: 2023-10-27 — End: ?

## 2023-11-08 DIAGNOSIS — F902 Attention-deficit hyperactivity disorder, combined type: Principal | ICD-10-CM

## 2023-11-08 MED ORDER — DEXTROAMPHETAMINE-AMPHETAMINE 10 MG TABLET
ORAL_TABLET | Freq: Every day | ORAL | 0 refills | 30.00000 days | Status: CP
Start: 2023-11-08 — End: ?

## 2023-11-09 ENCOUNTER — Ambulatory Visit: Admit: 2023-11-09 | Payer: PRIVATE HEALTH INSURANCE | Attending: Mental Health | Primary: Mental Health

## 2023-11-09 MED ORDER — DEXTROAMPHETAMINE-AMPHETAMINE ER 20 MG 24HR CAPSULE,EXTEND RELEASE
ORAL_CAPSULE | Freq: Every morning | ORAL | 0 refills | 30.00000 days | Status: CP
Start: 2023-11-09 — End: ?

## 2023-11-12 MED ORDER — GABAPENTIN 300 MG CAPSULE
ORAL_CAPSULE | ORAL | 1 refills | 0.00000 days | Status: CP
Start: 2023-11-12 — End: ?

## 2023-11-16 ENCOUNTER — Encounter
Admit: 2023-11-16 | Discharge: 2023-11-17 | Payer: BLUE CROSS/BLUE SHIELD | Attending: Mental Health | Primary: Mental Health

## 2023-11-16 DIAGNOSIS — F411 Generalized anxiety disorder: Principal | ICD-10-CM

## 2023-11-16 DIAGNOSIS — F902 Attention-deficit hyperactivity disorder, combined type: Principal | ICD-10-CM

## 2023-11-19 ENCOUNTER — Encounter: Admit: 2023-11-19 | Discharge: 2023-11-20 | Payer: BLUE CROSS/BLUE SHIELD

## 2023-11-19 DIAGNOSIS — F3342 Major depressive disorder, recurrent, in full remission: Principal | ICD-10-CM

## 2023-11-19 DIAGNOSIS — F909 Attention-deficit hyperactivity disorder, unspecified type: Principal | ICD-10-CM

## 2023-11-19 DIAGNOSIS — F411 Generalized anxiety disorder: Principal | ICD-10-CM

## 2023-11-23 ENCOUNTER — Encounter
Admit: 2023-11-23 | Discharge: 2023-11-24 | Payer: PRIVATE HEALTH INSURANCE | Attending: Mental Health | Primary: Mental Health

## 2023-11-23 DIAGNOSIS — F902 Attention-deficit hyperactivity disorder, combined type: Principal | ICD-10-CM

## 2023-11-23 DIAGNOSIS — F411 Generalized anxiety disorder: Principal | ICD-10-CM

## 2023-11-25 DIAGNOSIS — E890 Postprocedural hypothyroidism: Principal | ICD-10-CM

## 2023-11-25 MED ORDER — THYROID (PORK) 90 MG TABLET
ORAL_TABLET | Freq: Two times a day (BID) | ORAL | 2 refills | 90.00000 days | Status: CP
Start: 2023-11-25 — End: 2024-11-24

## 2023-11-29 MED ORDER — DEXTROAMPHETAMINE-AMPHETAMINE ER 20 MG 24HR CAPSULE,EXTEND RELEASE
ORAL_CAPSULE | Freq: Every morning | ORAL | 0 refills | 30.00000 days | Status: CP
Start: 2023-11-29 — End: 2023-12-28

## 2023-11-30 ENCOUNTER — Encounter
Admit: 2023-11-30 | Discharge: 2023-12-01 | Payer: PRIVATE HEALTH INSURANCE | Attending: Mental Health | Primary: Mental Health

## 2023-11-30 DIAGNOSIS — F411 Generalized anxiety disorder: Principal | ICD-10-CM

## 2023-11-30 DIAGNOSIS — F902 Attention-deficit hyperactivity disorder, combined type: Principal | ICD-10-CM

## 2023-12-07 DIAGNOSIS — L819 Disorder of pigmentation, unspecified: Principal | ICD-10-CM

## 2023-12-07 DIAGNOSIS — R238 Other skin changes: Principal | ICD-10-CM

## 2023-12-07 DIAGNOSIS — T50905A Adverse effect of unspecified drugs, medicaments and biological substances, initial encounter: Principal | ICD-10-CM

## 2023-12-07 DIAGNOSIS — L639 Alopecia areata, unspecified: Principal | ICD-10-CM

## 2023-12-07 MED ORDER — CLOBETASOL 0.05 % SCALP SOLUTION
Freq: Two times a day (BID) | TOPICAL | 5 refills | 0.00000 days | Status: CP
Start: 2023-12-07 — End: 2024-12-06

## 2023-12-07 MED ORDER — HYDROQUINONE 8 % TOPICAL EMULSION
TOPICAL | 2 refills | 0.00000 days | Status: CP
Start: 2023-12-07 — End: ?

## 2023-12-14 DIAGNOSIS — F411 Generalized anxiety disorder: Principal | ICD-10-CM

## 2023-12-14 DIAGNOSIS — F902 Attention-deficit hyperactivity disorder, combined type: Principal | ICD-10-CM

## 2023-12-21 DIAGNOSIS — F411 Generalized anxiety disorder: Principal | ICD-10-CM

## 2023-12-21 DIAGNOSIS — F902 Attention-deficit hyperactivity disorder, combined type: Principal | ICD-10-CM

## 2023-12-27 DIAGNOSIS — R202 Paresthesia of skin: Principal | ICD-10-CM

## 2023-12-28 DIAGNOSIS — F902 Attention-deficit hyperactivity disorder, combined type: Principal | ICD-10-CM

## 2023-12-28 DIAGNOSIS — F411 Generalized anxiety disorder: Principal | ICD-10-CM

## 2023-12-28 MED ORDER — DEXTROAMPHETAMINE-AMPHETAMINE ER 20 MG 24HR CAPSULE,EXTEND RELEASE
ORAL_CAPSULE | Freq: Every morning | ORAL | 0 refills | 30.00000 days | Status: CP
Start: 2023-12-28 — End: 2024-01-26

## 2023-12-31 ENCOUNTER — Inpatient Hospital Stay: Admit: 2023-12-31 | Discharge: 2024-01-01 | Payer: PRIVATE HEALTH INSURANCE

## 2024-01-04 DIAGNOSIS — F902 Attention-deficit hyperactivity disorder, combined type: Principal | ICD-10-CM

## 2024-01-04 DIAGNOSIS — F411 Generalized anxiety disorder: Principal | ICD-10-CM

## 2024-01-05 ENCOUNTER — Ambulatory Visit: Admit: 2024-01-05 | Discharge: 2024-01-05 | Payer: PRIVATE HEALTH INSURANCE

## 2024-01-05 ENCOUNTER — Encounter
Admit: 2024-01-05 | Discharge: 2024-01-05 | Payer: PRIVATE HEALTH INSURANCE | Attending: "Endocrinology | Primary: "Endocrinology

## 2024-01-05 ENCOUNTER — Ambulatory Visit
Admit: 2024-01-05 | Discharge: 2024-01-05 | Payer: PRIVATE HEALTH INSURANCE | Attending: "Endocrinology | Primary: "Endocrinology

## 2024-01-05 DIAGNOSIS — E039 Hypothyroidism, unspecified: Principal | ICD-10-CM

## 2024-01-05 DIAGNOSIS — M3219 Other organ or system involvement in systemic lupus erythematosus: Principal | ICD-10-CM

## 2024-01-07 DIAGNOSIS — F909 Attention-deficit hyperactivity disorder, unspecified type: Principal | ICD-10-CM

## 2024-01-07 DIAGNOSIS — F411 Generalized anxiety disorder: Principal | ICD-10-CM

## 2024-01-07 DIAGNOSIS — F3342 Major depressive disorder, recurrent, in full remission: Principal | ICD-10-CM

## 2024-01-07 MED ORDER — VENLAFAXINE ER 225 MG TABLET,EXTENDED RELEASE 24 HR
ORAL_TABLET | Freq: Every day | ORAL | 0 refills | 90.00000 days | Status: CP
Start: 2024-01-07 — End: 2024-01-07

## 2024-01-07 MED ORDER — TRAZODONE 100 MG TABLET
ORAL_TABLET | Freq: Every evening | ORAL | 2 refills | 30.00000 days | Status: CP
Start: 2024-01-07 — End: 2024-04-06

## 2024-01-07 MED ORDER — GABAPENTIN 300 MG CAPSULE
ORAL_CAPSULE | Freq: Every evening | ORAL | 2 refills | 30.00000 days | Status: CP
Start: 2024-01-07 — End: 2024-04-06

## 2024-01-10 DIAGNOSIS — G629 Polyneuropathy, unspecified: Principal | ICD-10-CM

## 2024-01-10 DIAGNOSIS — M3219 Other organ or system involvement in systemic lupus erythematosus: Principal | ICD-10-CM

## 2024-01-11 DIAGNOSIS — F902 Attention-deficit hyperactivity disorder, combined type: Principal | ICD-10-CM

## 2024-01-11 DIAGNOSIS — F411 Generalized anxiety disorder: Principal | ICD-10-CM

## 2024-01-11 DIAGNOSIS — F3342 Major depressive disorder, recurrent, in full remission: Principal | ICD-10-CM

## 2024-01-13 DIAGNOSIS — M3219 Other organ or system involvement in systemic lupus erythematosus: Principal | ICD-10-CM

## 2024-01-13 DIAGNOSIS — H5213 Myopia, bilateral: Principal | ICD-10-CM

## 2024-01-13 DIAGNOSIS — H04123 Dry eye syndrome of bilateral lacrimal glands: Principal | ICD-10-CM

## 2024-01-18 DIAGNOSIS — F902 Attention-deficit hyperactivity disorder, combined type: Principal | ICD-10-CM

## 2024-01-18 DIAGNOSIS — F3342 Major depressive disorder, recurrent, in full remission: Principal | ICD-10-CM

## 2024-01-18 DIAGNOSIS — F411 Generalized anxiety disorder: Principal | ICD-10-CM

## 2024-01-18 MED ORDER — DEXTROAMPHETAMINE-AMPHETAMINE ER 20 MG 24HR CAPSULE,EXTEND RELEASE
ORAL_CAPSULE | Freq: Every morning | ORAL | 0 refills | 30.00000 days | Status: CP
Start: 2024-01-18 — End: 2024-02-17

## 2024-01-25 ENCOUNTER — Ambulatory Visit: Admit: 2024-01-25 | Discharge: 2024-01-26 | Payer: PRIVATE HEALTH INSURANCE | Attending: Family | Primary: Family

## 2024-01-25 ENCOUNTER — Encounter: Admit: 2024-01-25 | Discharge: 2024-01-26 | Payer: PRIVATE HEALTH INSURANCE | Attending: Family | Primary: Family

## 2024-01-25 DIAGNOSIS — M2550 Pain in unspecified joint: Principal | ICD-10-CM

## 2024-01-25 DIAGNOSIS — M3219 Other organ or system involvement in systemic lupus erythematosus: Principal | ICD-10-CM

## 2024-01-25 DIAGNOSIS — M542 Cervicalgia: Principal | ICD-10-CM

## 2024-01-25 MED ORDER — PREDNISONE 5 MG TABLET
ORAL_TABLET | Freq: Every day | ORAL | 0 refills | 90.00000 days | Status: CP
Start: 2024-01-25 — End: ?

## 2024-01-25 MED ORDER — PREDNISONE 1 MG TABLET
ORAL_TABLET | Freq: Every day | ORAL | 1 refills | 30.00000 days | Status: CP
Start: 2024-01-25 — End: ?

## 2024-01-25 MED ORDER — LEFLUNOMIDE 20 MG TABLET
ORAL_TABLET | Freq: Every day | ORAL | 1 refills | 90.00000 days | Status: CP
Start: 2024-01-25 — End: ?

## 2024-01-27 ENCOUNTER — Inpatient Hospital Stay: Admit: 2024-01-27 | Discharge: 2024-01-27 | Payer: PRIVATE HEALTH INSURANCE

## 2024-01-27 DIAGNOSIS — L639 Alopecia areata, unspecified: Principal | ICD-10-CM

## 2024-01-27 MED ORDER — DEXTROAMPHETAMINE-AMPHETAMINE ER 20 MG 24HR CAPSULE,EXTEND RELEASE
ORAL_CAPSULE | Freq: Every morning | ORAL | 0 refills | 30.00000 days | Status: CP
Start: 2024-01-27 — End: 2024-02-25

## 2024-02-08 DIAGNOSIS — F3342 Major depressive disorder, recurrent, in full remission: Principal | ICD-10-CM

## 2024-02-08 DIAGNOSIS — F902 Attention-deficit hyperactivity disorder, combined type: Principal | ICD-10-CM

## 2024-02-08 DIAGNOSIS — F411 Generalized anxiety disorder: Principal | ICD-10-CM

## 2024-02-11 ENCOUNTER — Ambulatory Visit: Admit: 2024-02-11 | Discharge: 2024-02-11 | Payer: PRIVATE HEALTH INSURANCE

## 2024-02-11 DIAGNOSIS — G629 Polyneuropathy, unspecified: Principal | ICD-10-CM

## 2024-02-15 DIAGNOSIS — F3342 Major depressive disorder, recurrent, in full remission: Principal | ICD-10-CM

## 2024-02-15 DIAGNOSIS — F411 Generalized anxiety disorder: Principal | ICD-10-CM

## 2024-02-15 DIAGNOSIS — F902 Attention-deficit hyperactivity disorder, combined type: Principal | ICD-10-CM

## 2024-02-15 MED ORDER — DEXTROAMPHETAMINE-AMPHETAMINE ER 20 MG 24HR CAPSULE,EXTEND RELEASE
ORAL_CAPSULE | Freq: Every morning | ORAL | 0 refills | 30.00000 days | Status: CP
Start: 2024-02-15 — End: 2024-03-16

## 2024-02-22 DIAGNOSIS — F3342 Major depressive disorder, recurrent, in full remission: Principal | ICD-10-CM

## 2024-02-22 DIAGNOSIS — G63 Polyneuropathy in diseases classified elsewhere: Principal | ICD-10-CM

## 2024-02-22 DIAGNOSIS — E611 Iron deficiency: Principal | ICD-10-CM

## 2024-02-22 DIAGNOSIS — G992 Myelopathy in diseases classified elsewhere: Principal | ICD-10-CM

## 2024-02-22 DIAGNOSIS — G629 Polyneuropathy, unspecified: Principal | ICD-10-CM

## 2024-02-22 DIAGNOSIS — F902 Attention-deficit hyperactivity disorder, combined type: Principal | ICD-10-CM

## 2024-02-22 DIAGNOSIS — E610 Copper deficiency: Principal | ICD-10-CM

## 2024-02-22 DIAGNOSIS — F411 Generalized anxiety disorder: Principal | ICD-10-CM

## 2024-02-22 MED ORDER — FERROUS SULFATE 325 MG (65 MG IRON) TABLET,DELAYED RELEASE
ORAL_TABLET | Freq: Two times a day (BID) | ORAL | 3 refills | 30.00000 days | Status: CP
Start: 2024-02-22 — End: 2025-02-21

## 2024-02-22 MED ORDER — COPPER GLUCONATE 2 MG CAPSULE
ORAL_CAPSULE | ORAL | 0 refills | 28.00000 days | Status: CP
Start: 2024-02-22 — End: 2024-02-22

## 2024-02-22 MED ORDER — COPPER GLUCONATE 2 MG TABLET
ORAL_TABLET | ORAL | 0 refills | 28.00000 days | Status: CP
Start: 2024-02-22 — End: 2024-03-21

## 2024-02-25 DIAGNOSIS — F411 Generalized anxiety disorder: Principal | ICD-10-CM

## 2024-02-25 DIAGNOSIS — F3341 Major depressive disorder, recurrent, in partial remission: Principal | ICD-10-CM

## 2024-02-25 DIAGNOSIS — F909 Attention-deficit hyperactivity disorder, unspecified type: Principal | ICD-10-CM

## 2024-02-25 DIAGNOSIS — G2581 Restless legs syndrome: Principal | ICD-10-CM

## 2024-02-25 MED ORDER — GABAPENTIN 400 MG TABLET
ORAL_TABLET | ORAL | 1 refills | 0.00000 days | Status: CP
Start: 2024-02-25 — End: 2024-04-25

## 2024-02-29 MED ORDER — LEVOTHYROXINE 100 MCG TABLET
ORAL_TABLET | Freq: Every day | ORAL | 0 refills | 30.00000 days | Status: CP
Start: 2024-02-29 — End: 2025-02-23

## 2024-03-03 DIAGNOSIS — G2581 Restless legs syndrome: Principal | ICD-10-CM

## 2024-03-03 DIAGNOSIS — F411 Generalized anxiety disorder: Principal | ICD-10-CM

## 2024-03-03 MED ORDER — GABAPENTIN 100 MG CAPSULE
ORAL_CAPSULE | Freq: Every day | ORAL | 1 refills | 30.00000 days | Status: CP | PRN
Start: 2024-03-03 — End: 2024-05-02

## 2024-03-06 ENCOUNTER — Ambulatory Visit: Admit: 2024-03-06 | Discharge: 2024-03-07 | Payer: PRIVATE HEALTH INSURANCE

## 2024-03-06 DIAGNOSIS — M545 Thoracolumbar back pain: Principal | ICD-10-CM

## 2024-03-06 DIAGNOSIS — R202 Paresthesia of skin: Principal | ICD-10-CM

## 2024-03-06 DIAGNOSIS — M546 Pain in thoracic spine: Principal | ICD-10-CM

## 2024-03-06 DIAGNOSIS — M41125 Adolescent idiopathic scoliosis, thoracolumbar region: Principal | ICD-10-CM

## 2024-03-07 DIAGNOSIS — F902 Attention-deficit hyperactivity disorder, combined type: Principal | ICD-10-CM

## 2024-03-07 DIAGNOSIS — F3341 Major depressive disorder, recurrent, in partial remission: Principal | ICD-10-CM

## 2024-03-07 DIAGNOSIS — F411 Generalized anxiety disorder: Principal | ICD-10-CM

## 2024-03-14 DIAGNOSIS — L639 Alopecia areata, unspecified: Principal | ICD-10-CM

## 2024-03-14 DIAGNOSIS — F411 Generalized anxiety disorder: Principal | ICD-10-CM

## 2024-03-14 DIAGNOSIS — F3341 Major depressive disorder, recurrent, in partial remission: Principal | ICD-10-CM

## 2024-03-14 DIAGNOSIS — F902 Attention-deficit hyperactivity disorder, combined type: Principal | ICD-10-CM

## 2024-03-14 MED ORDER — DEXTROAMPHETAMINE-AMPHETAMINE ER 20 MG 24HR CAPSULE,EXTEND RELEASE
ORAL_CAPSULE | Freq: Every morning | ORAL | 0 refills | 30.00000 days | Status: CP
Start: 2024-03-14 — End: 2024-04-13

## 2024-03-15 ENCOUNTER — Ambulatory Visit: Admit: 2024-03-15 | Discharge: 2024-03-16 | Payer: PRIVATE HEALTH INSURANCE | Attending: Family | Primary: Family

## 2024-03-15 DIAGNOSIS — M3219 Other organ or system involvement in systemic lupus erythematosus: Principal | ICD-10-CM

## 2024-03-15 DIAGNOSIS — J392 Other diseases of pharynx: Principal | ICD-10-CM

## 2024-03-15 DIAGNOSIS — J029 Acute pharyngitis, unspecified: Principal | ICD-10-CM

## 2024-03-15 MED ORDER — PREDNISONE 5 MG TABLET
ORAL_TABLET | Freq: Every day | ORAL | 0 refills | 90.00000 days | Status: CP
Start: 2024-03-15 — End: 2024-04-04

## 2024-03-15 MED ORDER — LIDOCAINE HCL 2 % MUCOSAL SOLUTION
OROMUCOSAL | 0 refills | 7.00000 days | Status: CP
Start: 2024-03-15 — End: 2024-03-22

## 2024-03-21 DIAGNOSIS — F411 Generalized anxiety disorder: Principal | ICD-10-CM

## 2024-03-21 DIAGNOSIS — F902 Attention-deficit hyperactivity disorder, combined type: Principal | ICD-10-CM

## 2024-03-21 DIAGNOSIS — F3341 Major depressive disorder, recurrent, in partial remission: Principal | ICD-10-CM

## 2024-03-22 DIAGNOSIS — M3219 Other organ or system involvement in systemic lupus erythematosus: Principal | ICD-10-CM

## 2024-03-22 MED ORDER — LEFLUNOMIDE 20 MG TABLET
ORAL_TABLET | Freq: Every day | ORAL | 1 refills | 0.00000 days
Start: 2024-03-22 — End: ?

## 2024-03-23 DIAGNOSIS — L309 Dermatitis, unspecified: Principal | ICD-10-CM

## 2024-03-23 MED ORDER — CLOBETASOL 0.05 % TOPICAL OINTMENT
OPHTHALMIC | 1 refills | 0.00000 days | Status: CP
Start: 2024-03-23 — End: ?

## 2024-03-27 MED ORDER — LEFLUNOMIDE 20 MG TABLET
ORAL_TABLET | Freq: Every day | ORAL | 1 refills | 90.00000 days
Start: 2024-03-27 — End: ?

## 2024-03-28 DIAGNOSIS — F902 Attention-deficit hyperactivity disorder, combined type: Principal | ICD-10-CM

## 2024-03-28 DIAGNOSIS — F3341 Major depressive disorder, recurrent, in partial remission: Principal | ICD-10-CM

## 2024-03-28 DIAGNOSIS — F411 Generalized anxiety disorder: Principal | ICD-10-CM

## 2024-03-31 MED ORDER — LEVOTHYROXINE 100 MCG TABLET
ORAL_TABLET | Freq: Every day | ORAL | 0 refills | 30.00000 days | Status: CP
Start: 2024-03-31 — End: 2025-03-26

## 2024-04-04 DIAGNOSIS — F902 Attention-deficit hyperactivity disorder, combined type: Principal | ICD-10-CM

## 2024-04-04 DIAGNOSIS — F411 Generalized anxiety disorder: Principal | ICD-10-CM

## 2024-04-04 DIAGNOSIS — F3341 Major depressive disorder, recurrent, in partial remission: Principal | ICD-10-CM

## 2024-04-07 ENCOUNTER — Inpatient Hospital Stay: Admit: 2024-04-07 | Discharge: 2024-04-07 | Payer: PRIVATE HEALTH INSURANCE

## 2024-04-07 DIAGNOSIS — F902 Attention-deficit hyperactivity disorder, combined type: Principal | ICD-10-CM

## 2024-04-07 DIAGNOSIS — F331 Major depressive disorder, recurrent, moderate: Principal | ICD-10-CM

## 2024-04-07 DIAGNOSIS — G2581 Restless legs syndrome: Principal | ICD-10-CM

## 2024-04-07 DIAGNOSIS — F411 Generalized anxiety disorder: Principal | ICD-10-CM

## 2024-04-07 MED ORDER — ESCITALOPRAM 10 MG TABLET
ORAL_TABLET | ORAL | 0 refills | 74.00000 days | Status: CP
Start: 2024-04-07 — End: 2024-06-20

## 2024-04-07 MED ORDER — VENLAFAXINE ER 75 MG TABLET,EXTENDED RELEASE 24 HR
ORAL_TABLET | ORAL | 0 refills | 21.00000 days | Status: CP
Start: 2024-04-07 — End: 2024-04-28

## 2024-04-11 DIAGNOSIS — T17308A Unspecified foreign body in larynx causing other injury, initial encounter: Principal | ICD-10-CM

## 2024-04-11 DIAGNOSIS — H9313 Tinnitus, bilateral: Principal | ICD-10-CM

## 2024-04-11 DIAGNOSIS — H838X3 Other specified diseases of inner ear, bilateral: Principal | ICD-10-CM

## 2024-04-11 DIAGNOSIS — R1310 Dysphagia, unspecified: Principal | ICD-10-CM

## 2024-04-11 DIAGNOSIS — J3800 Paralysis of vocal cords and larynx, unspecified: Principal | ICD-10-CM

## 2024-04-11 DIAGNOSIS — R041 Hemorrhage from throat: Principal | ICD-10-CM

## 2024-04-12 DIAGNOSIS — G629 Polyneuropathy, unspecified: Principal | ICD-10-CM

## 2024-04-14 ENCOUNTER — Ambulatory Visit: Admit: 2024-04-14 | Discharge: 2024-04-15 | Payer: PRIVATE HEALTH INSURANCE

## 2024-04-14 DIAGNOSIS — G629 Polyneuropathy, unspecified: Principal | ICD-10-CM

## 2024-04-18 DIAGNOSIS — F902 Attention-deficit hyperactivity disorder, combined type: Principal | ICD-10-CM

## 2024-04-18 DIAGNOSIS — F331 Major depressive disorder, recurrent, moderate: Principal | ICD-10-CM

## 2024-04-18 DIAGNOSIS — F411 Generalized anxiety disorder: Principal | ICD-10-CM

## 2024-04-21 MED ORDER — DEXTROAMPHETAMINE-AMPHETAMINE ER 20 MG 24HR CAPSULE,EXTEND RELEASE
ORAL_CAPSULE | Freq: Every morning | ORAL | 0 refills | 30.00000 days
Start: 2024-04-21 — End: ?

## 2024-04-21 MED ORDER — TRAZODONE 100 MG TABLET
ORAL_TABLET | Freq: Every evening | ORAL | 1 refills | 90.00000 days
Start: 2024-04-21 — End: 2024-10-18

## 2024-04-22 MED ORDER — TRAZODONE 100 MG TABLET
ORAL_TABLET | Freq: Every evening | ORAL | 1 refills | 90.00000 days | Status: CP
Start: 2024-04-22 — End: 2024-10-19

## 2024-04-22 MED ORDER — DEXTROAMPHETAMINE-AMPHETAMINE ER 20 MG 24HR CAPSULE,EXTEND RELEASE
ORAL_CAPSULE | Freq: Every morning | ORAL | 0 refills | 30.00000 days | Status: CP
Start: 2024-04-22 — End: ?

## 2024-04-25 DIAGNOSIS — F902 Attention-deficit hyperactivity disorder, combined type: Principal | ICD-10-CM

## 2024-04-25 DIAGNOSIS — F331 Major depressive disorder, recurrent, moderate: Principal | ICD-10-CM

## 2024-04-25 DIAGNOSIS — L639 Alopecia areata, unspecified: Principal | ICD-10-CM

## 2024-04-25 DIAGNOSIS — F411 Generalized anxiety disorder: Principal | ICD-10-CM

## 2024-04-25 DIAGNOSIS — Z79899 Other long term (current) drug therapy: Principal | ICD-10-CM

## 2024-04-27 DIAGNOSIS — Z79899 Other long term (current) drug therapy: Principal | ICD-10-CM

## 2024-04-27 MED ORDER — LEVOTHYROXINE 100 MCG TABLET
ORAL_TABLET | Freq: Every day | ORAL | 0 refills | 30.00000 days | Status: CP
Start: 2024-04-27 — End: 2025-04-22

## 2024-05-01 DIAGNOSIS — F902 Attention-deficit hyperactivity disorder, combined type: Principal | ICD-10-CM

## 2024-05-01 DIAGNOSIS — F411 Generalized anxiety disorder: Principal | ICD-10-CM

## 2024-05-01 DIAGNOSIS — F331 Major depressive disorder, recurrent, moderate: Principal | ICD-10-CM

## 2024-05-05 DIAGNOSIS — F331 Major depressive disorder, recurrent, moderate: Principal | ICD-10-CM

## 2024-05-05 DIAGNOSIS — F909 Attention-deficit hyperactivity disorder, unspecified type: Principal | ICD-10-CM

## 2024-05-05 DIAGNOSIS — F3342 Major depressive disorder, recurrent, in full remission: Principal | ICD-10-CM

## 2024-05-05 MED ORDER — GABAPENTIN 400 MG CAPSULE
ORAL_CAPSULE | Freq: Every evening | ORAL | 1 refills | 45.00000 days
Start: 2024-05-05 — End: 2024-08-03

## 2024-05-08 DIAGNOSIS — F411 Generalized anxiety disorder: Principal | ICD-10-CM

## 2024-05-08 DIAGNOSIS — F902 Attention-deficit hyperactivity disorder, combined type: Principal | ICD-10-CM

## 2024-05-08 DIAGNOSIS — F331 Major depressive disorder, recurrent, moderate: Principal | ICD-10-CM

## 2024-05-08 MED ORDER — DEXTROAMPHETAMINE-AMPHETAMINE ER 20 MG 24HR CAPSULE,EXTEND RELEASE
ORAL_CAPSULE | Freq: Every morning | ORAL | 0 refills | 30.00000 days | Status: CP
Start: 2024-05-08 — End: 2024-06-07

## 2024-05-08 MED ORDER — GABAPENTIN 400 MG CAPSULE
ORAL_CAPSULE | Freq: Every evening | ORAL | 2 refills | 45.00000 days | Status: CP
Start: 2024-05-08 — End: 2024-09-20

## 2024-05-09 ENCOUNTER — Ambulatory Visit: Admit: 2024-05-09 | Discharge: 2024-05-09 | Payer: PRIVATE HEALTH INSURANCE

## 2024-05-09 ENCOUNTER — Ambulatory Visit
Admit: 2024-05-09 | Discharge: 2024-05-09 | Payer: PRIVATE HEALTH INSURANCE | Attending: Orthopedic | Primary: Orthopedic

## 2024-05-09 DIAGNOSIS — M546 Pain in thoracic spine: Principal | ICD-10-CM

## 2024-05-09 DIAGNOSIS — Z79899 Other long term (current) drug therapy: Principal | ICD-10-CM

## 2024-05-09 DIAGNOSIS — J3489 Other specified disorders of nose and nasal sinuses: Principal | ICD-10-CM

## 2024-05-09 DIAGNOSIS — M41125 Adolescent idiopathic scoliosis, thoracolumbar region: Principal | ICD-10-CM

## 2024-05-09 DIAGNOSIS — R202 Paresthesia of skin: Principal | ICD-10-CM

## 2024-05-09 DIAGNOSIS — R490 Dysphonia: Principal | ICD-10-CM

## 2024-05-09 DIAGNOSIS — G629 Polyneuropathy, unspecified: Principal | ICD-10-CM

## 2024-05-09 DIAGNOSIS — R040 Epistaxis: Principal | ICD-10-CM

## 2024-05-09 DIAGNOSIS — M545 Thoracolumbar back pain: Principal | ICD-10-CM

## 2024-05-09 MED ORDER — MUPIROCIN 2 % TOPICAL OINTMENT
TOPICAL | 11 refills | 0.00000 days | Status: CP
Start: 2024-05-09 — End: ?

## 2024-05-11 DIAGNOSIS — L639 Alopecia areata, unspecified: Principal | ICD-10-CM

## 2024-05-11 MED ORDER — BARICITINIB 2 MG TABLET
ORAL_TABLET | Freq: Every day | ORAL | 3 refills | 90.00000 days | Status: CP
Start: 2024-05-11 — End: 2024-05-11

## 2024-05-16 DIAGNOSIS — F902 Attention-deficit hyperactivity disorder, combined type: Principal | ICD-10-CM

## 2024-05-16 DIAGNOSIS — F411 Generalized anxiety disorder: Principal | ICD-10-CM

## 2024-05-16 DIAGNOSIS — F331 Major depressive disorder, recurrent, moderate: Principal | ICD-10-CM

## 2024-05-21 MED ORDER — LEVOTHYROXINE 100 MCG TABLET
ORAL_TABLET | 0 refills | 0.00000 days
Start: 2024-05-21 — End: ?

## 2024-05-22 DIAGNOSIS — L309 Dermatitis, unspecified: Principal | ICD-10-CM

## 2024-05-22 MED ORDER — CLOBETASOL 0.05 % TOPICAL OINTMENT
OPHTHALMIC | 1 refills | 0.00000 days | Status: CP
Start: 2024-05-22 — End: ?

## 2024-05-23 ENCOUNTER — Ambulatory Visit
Admit: 2024-05-23 | Discharge: 2024-05-23 | Payer: Medicare (Managed Care) | Attending: Internal Medicine | Primary: Internal Medicine

## 2024-05-23 ENCOUNTER — Inpatient Hospital Stay: Admit: 2024-05-23 | Discharge: 2024-05-23 | Payer: Medicare (Managed Care)

## 2024-05-23 ENCOUNTER — Ambulatory Visit: Admit: 2024-05-23 | Discharge: 2024-05-23 | Payer: Medicare (Managed Care)

## 2024-05-23 DIAGNOSIS — R634 Abnormal weight loss: Principal | ICD-10-CM

## 2024-05-23 DIAGNOSIS — R0602 Shortness of breath: Principal | ICD-10-CM

## 2024-05-23 DIAGNOSIS — E063 Autoimmune thyroiditis: Principal | ICD-10-CM

## 2024-05-23 DIAGNOSIS — M3219 Other organ or system involvement in systemic lupus erythematosus: Principal | ICD-10-CM

## 2024-05-23 MED ORDER — LEVOTHYROXINE 100 MCG TABLET
ORAL_TABLET | ORAL | 0 refills | 0.00000 days | Status: CP
Start: 2024-05-23 — End: ?

## 2024-05-25 ENCOUNTER — Inpatient Hospital Stay: Admit: 2024-05-25 | Discharge: 2024-05-25 | Payer: Medicare (Managed Care)

## 2024-05-25 DIAGNOSIS — F909 Attention-deficit hyperactivity disorder, unspecified type: Principal | ICD-10-CM

## 2024-05-30 DIAGNOSIS — F331 Major depressive disorder, recurrent, moderate: Principal | ICD-10-CM

## 2024-05-30 DIAGNOSIS — F902 Attention-deficit hyperactivity disorder, combined type: Principal | ICD-10-CM

## 2024-05-30 DIAGNOSIS — F909 Attention-deficit hyperactivity disorder, unspecified type: Principal | ICD-10-CM

## 2024-05-30 DIAGNOSIS — F411 Generalized anxiety disorder: Principal | ICD-10-CM

## 2024-05-30 MED ORDER — DEXTROAMPHETAMINE-AMPHETAMINE ER 20 MG 24HR CAPSULE,EXTEND RELEASE
ORAL_CAPSULE | Freq: Every morning | ORAL | 0 refills | 30.00000 days
Start: 2024-05-30 — End: 2024-06-29

## 2024-06-01 DIAGNOSIS — F909 Attention-deficit hyperactivity disorder, unspecified type: Principal | ICD-10-CM

## 2024-06-01 MED ORDER — DEXTROAMPHETAMINE-AMPHETAMINE ER 10 MG 24HR CAPSULE,EXTEND RELEASE
ORAL_CAPSULE | Freq: Every morning | ORAL | 0 refills | 30.00000 days | Status: CP
Start: 2024-06-01 — End: 2024-07-01

## 2024-06-06 DIAGNOSIS — F902 Attention-deficit hyperactivity disorder, combined type: Principal | ICD-10-CM

## 2024-06-06 DIAGNOSIS — F411 Generalized anxiety disorder: Principal | ICD-10-CM

## 2024-06-06 MED ORDER — ESCITALOPRAM 20 MG TABLET
ORAL_TABLET | Freq: Every day | ORAL | 2 refills | 90.00000 days | Status: CP
Start: 2024-06-06 — End: 2025-03-03

## 2024-06-07 MED ORDER — DEXTROAMPHETAMINE-AMPHETAMINE ER 20 MG 24HR CAPSULE,EXTEND RELEASE
ORAL_CAPSULE | Freq: Every morning | ORAL | 0 refills | 30.00000 days | Status: CP
Start: 2024-06-07 — End: 2024-07-07

## 2024-06-08 DIAGNOSIS — F909 Attention-deficit hyperactivity disorder, unspecified type: Principal | ICD-10-CM

## 2024-06-08 MED ORDER — DEXTROAMPHETAMINE-AMPHETAMINE 20 MG TABLET
ORAL_TABLET | Freq: Two times a day (BID) | ORAL | 0 refills | 30.00000 days | Status: CP
Start: 2024-06-08 — End: 2024-07-08

## 2024-06-09 MED ORDER — DEXTROAMPHETAMINE-AMPHETAMINE ER 20 MG 24HR CAPSULE,EXTEND RELEASE
ORAL_CAPSULE | Freq: Every morning | ORAL | 0 refills | 30.00000 days
Start: 2024-06-09 — End: 2024-07-09

## 2024-06-18 MED ORDER — PREDNISONE 5 MG TABLET
ORAL_TABLET | Freq: Every day | ORAL | 0.00000 days
Start: 2024-06-18 — End: ?

## 2024-06-19 MED ORDER — PREDNISONE 5 MG TABLET
ORAL_TABLET | Freq: Every day | ORAL | 0 refills | 90.00000 days | Status: CP
Start: 2024-06-19 — End: ?

## 2024-06-20 MED ORDER — LEVOTHYROXINE 100 MCG TABLET
ORAL_TABLET | ORAL | 0 refills | 0.00000 days | Status: CP
Start: 2024-06-20 — End: ?

## 2024-06-26 ENCOUNTER — Encounter
Admit: 2024-06-26 | Discharge: 2024-06-26 | Payer: Medicare (Managed Care) | Attending: Student in an Organized Health Care Education/Training Program | Primary: Student in an Organized Health Care Education/Training Program

## 2024-06-26 DIAGNOSIS — F902 Attention-deficit hyperactivity disorder, combined type: Principal | ICD-10-CM

## 2024-06-26 DIAGNOSIS — E611 Iron deficiency: Principal | ICD-10-CM

## 2024-06-26 DIAGNOSIS — M3212 Pericarditis in systemic lupus erythematosus: Principal | ICD-10-CM

## 2024-06-26 DIAGNOSIS — R634 Abnormal weight loss: Principal | ICD-10-CM

## 2024-06-26 DIAGNOSIS — E890 Postprocedural hypothyroidism: Principal | ICD-10-CM

## 2024-06-26 DIAGNOSIS — F411 Generalized anxiety disorder: Principal | ICD-10-CM

## 2024-06-26 DIAGNOSIS — F32A Depression, unspecified depression type: Principal | ICD-10-CM

## 2024-06-26 DIAGNOSIS — J4521 Mild intermittent asthma with (acute) exacerbation: Principal | ICD-10-CM

## 2024-06-26 MED ORDER — BUDESONIDE-FORMOTEROL HFA 80 MCG-4.5 MCG/ACTUATION AEROSOL INHALER
RESPIRATORY_TRACT | 1 refills | 7.00000 days | Status: CP | PRN
Start: 2024-06-26 — End: 2025-06-26

## 2024-06-27 DIAGNOSIS — E039 Hypothyroidism, unspecified: Principal | ICD-10-CM

## 2024-07-01 MED ORDER — DEXTROAMPHETAMINE-AMPHETAMINE ER 10 MG 24HR CAPSULE,EXTEND RELEASE
ORAL_CAPSULE | Freq: Every morning | ORAL | 0 refills | 30.00000 days | Status: CP
Start: 2024-07-01 — End: 2024-07-31

## 2024-07-03 ENCOUNTER — Ambulatory Visit: Admit: 2024-07-03 | Discharge: 2024-07-04 | Payer: Medicare (Managed Care)

## 2024-07-03 DIAGNOSIS — M4125 Other idiopathic scoliosis, thoracolumbar region: Principal | ICD-10-CM

## 2024-07-03 DIAGNOSIS — G629 Polyneuropathy, unspecified: Principal | ICD-10-CM

## 2024-07-05 DIAGNOSIS — E611 Iron deficiency: Principal | ICD-10-CM

## 2024-07-05 DIAGNOSIS — D508 Other iron deficiency anemias: Principal | ICD-10-CM

## 2024-07-05 DIAGNOSIS — D509 Iron deficiency anemia, unspecified: Principal | ICD-10-CM

## 2024-07-07 ENCOUNTER — Inpatient Hospital Stay: Admit: 2024-07-07 | Discharge: 2024-07-07 | Payer: Medicare (Managed Care)

## 2024-07-07 ENCOUNTER — Inpatient Hospital Stay
Admit: 2024-07-07 | Discharge: 2024-07-07 | Payer: Medicare (Managed Care) | Attending: Speech-Language Pathologist | Primary: Speech-Language Pathologist

## 2024-07-07 MED ORDER — DEXTROAMPHETAMINE-AMPHETAMINE ER 20 MG 24HR CAPSULE,EXTEND RELEASE
ORAL_CAPSULE | Freq: Every morning | ORAL | 0 refills | 30.00000 days | Status: CP
Start: 2024-07-07 — End: 2024-08-06

## 2024-07-11 DIAGNOSIS — M3219 Other organ or system involvement in systemic lupus erythematosus: Principal | ICD-10-CM

## 2024-07-11 MED ORDER — PREDNISONE 5 MG TABLET
ORAL_TABLET | Freq: Every day | ORAL | 0 refills | 60.00000 days | Status: CP
Start: 2024-07-11 — End: ?

## 2024-07-13 DIAGNOSIS — R490 Dysphonia: Principal | ICD-10-CM

## 2024-07-13 DIAGNOSIS — J3800 Paralysis of vocal cords and larynx, unspecified: Principal | ICD-10-CM

## 2024-07-13 DIAGNOSIS — K224 Dyskinesia of esophagus: Principal | ICD-10-CM

## 2024-07-14 DIAGNOSIS — J3800 Paralysis of vocal cords and larynx, unspecified: Secondary | ICD-10-CM

## 2024-07-14 DIAGNOSIS — K224 Dyskinesia of esophagus: Secondary | ICD-10-CM

## 2024-07-14 DIAGNOSIS — R490 Dysphonia: Principal | ICD-10-CM

## 2024-07-15 DIAGNOSIS — L309 Dermatitis, unspecified: Principal | ICD-10-CM

## 2024-07-15 MED ORDER — CLOBETASOL 0.05 % TOPICAL OINTMENT
1 refills | 0.00000 days
Start: 2024-07-15 — End: ?

## 2024-07-17 DIAGNOSIS — L309 Dermatitis, unspecified: Principal | ICD-10-CM

## 2024-07-17 MED ORDER — CLOBETASOL 0.05 % TOPICAL OINTMENT
OPHTHALMIC | 1 refills | 0.00000 days | Status: CP
Start: 2024-07-17 — End: ?

## 2024-07-20 ENCOUNTER — Ambulatory Visit: Admit: 2024-07-20 | Discharge: 2024-07-21 | Payer: PRIVATE HEALTH INSURANCE

## 2024-07-20 ENCOUNTER — Ambulatory Visit: Admit: 2024-07-20 | Discharge: 2024-07-21

## 2024-07-21 MED ORDER — LEVOTHYROXINE 100 MCG TABLET
ORAL_TABLET | ORAL | 0 refills | 0.00000 days | Status: CP
Start: 2024-07-21 — End: ?

## 2024-07-24 DIAGNOSIS — F331 Major depressive disorder, recurrent, moderate: Secondary | ICD-10-CM

## 2024-07-24 DIAGNOSIS — F902 Attention-deficit hyperactivity disorder, combined type: Principal | ICD-10-CM

## 2024-07-24 DIAGNOSIS — F411 Generalized anxiety disorder: Secondary | ICD-10-CM

## 2024-07-25 ENCOUNTER — Inpatient Hospital Stay: Admit: 2024-07-25 | Discharge: 2024-07-25

## 2024-07-31 MED ORDER — DEXTROAMPHETAMINE-AMPHETAMINE ER 10 MG 24HR CAPSULE,EXTEND RELEASE
ORAL_CAPSULE | Freq: Every morning | ORAL | 0 refills | 30.00000 days | Status: CP
Start: 2024-07-31 — End: 2024-08-30
